# Patient Record
Sex: Male | Born: 1935 | Race: White | Hispanic: No | Marital: Single | Smoking: Former smoker
Health system: Southern US, Community
[De-identification: ages and names within clinical notes are randomized; demographics above are authoritative.]

## PROBLEM LIST (undated history)

## (undated) DIAGNOSIS — K5190 Ulcerative colitis, unspecified, without complications: Secondary | ICD-10-CM

## (undated) DIAGNOSIS — B353 Tinea pedis: Secondary | ICD-10-CM

## (undated) DIAGNOSIS — N401 Enlarged prostate with lower urinary tract symptoms: Secondary | ICD-10-CM

## (undated) DIAGNOSIS — H109 Unspecified conjunctivitis: Secondary | ICD-10-CM

## (undated) DIAGNOSIS — I10 Essential (primary) hypertension: Secondary | ICD-10-CM

## (undated) DIAGNOSIS — M1990 Unspecified osteoarthritis, unspecified site: Secondary | ICD-10-CM

## (undated) DIAGNOSIS — R29898 Other symptoms and signs involving the musculoskeletal system: Secondary | ICD-10-CM

## (undated) DIAGNOSIS — J158 Pneumonia due to other specified bacteria: Secondary | ICD-10-CM

## (undated) DIAGNOSIS — E079 Disorder of thyroid, unspecified: Secondary | ICD-10-CM

## (undated) DIAGNOSIS — N138 Other obstructive and reflux uropathy: Secondary | ICD-10-CM

## (undated) DIAGNOSIS — R609 Edema, unspecified: Secondary | ICD-10-CM

---

## 2013-02-19 ENCOUNTER — Inpatient Hospital Stay (HOSPITAL_COMMUNITY): Payer: Medicare Other

## 2013-02-19 ENCOUNTER — Inpatient Hospital Stay (HOSPITAL_COMMUNITY)
Admission: EM | Admit: 2013-02-19 | Discharge: 2013-02-23 | DRG: 194 | Disposition: A | Discharge status: Skilled Nursing Facility | Payer: Medicare Other | Attending: Internal Medicine | Admitting: Internal Medicine

## 2013-02-19 ENCOUNTER — Encounter (HOSPITAL_COMMUNITY): Payer: Self-pay | Admitting: *Deleted

## 2013-02-19 ENCOUNTER — Emergency Department (HOSPITAL_COMMUNITY): Payer: Medicare Other

## 2013-02-19 DIAGNOSIS — R296 Repeated falls: Secondary | ICD-10-CM

## 2013-02-19 DIAGNOSIS — J189 Pneumonia, unspecified organism: Secondary | ICD-10-CM | POA: Diagnosis present

## 2013-02-19 DIAGNOSIS — D649 Anemia, unspecified: Secondary | ICD-10-CM | POA: Diagnosis present

## 2013-02-19 DIAGNOSIS — R509 Fever, unspecified: Secondary | ICD-10-CM

## 2013-02-19 DIAGNOSIS — M1990 Unspecified osteoarthritis, unspecified site: Secondary | ICD-10-CM | POA: Diagnosis present

## 2013-02-19 DIAGNOSIS — R42 Dizziness and giddiness: Secondary | ICD-10-CM | POA: Diagnosis present

## 2013-02-19 HISTORY — DX: Other symptoms and signs involving the musculoskeletal system: R29.898

## 2013-02-19 HISTORY — DX: Other obstructive and reflux uropathy: N13.8

## 2013-02-19 HISTORY — DX: Pneumonia due to other specified bacteria: J15.8

## 2013-02-19 HISTORY — DX: Ulcerative colitis, unspecified, without complications: K51.90

## 2013-02-19 HISTORY — DX: Tinea pedis: B35.3

## 2013-02-19 HISTORY — DX: Disorder of thyroid, unspecified: E07.9

## 2013-02-19 HISTORY — DX: Benign prostatic hyperplasia with lower urinary tract symptoms: N40.1

## 2013-02-19 HISTORY — DX: Unspecified conjunctivitis: H10.9

## 2013-02-19 HISTORY — DX: Essential (primary) hypertension: I10

## 2013-02-19 HISTORY — DX: Unspecified osteoarthritis, unspecified site: M19.90

## 2013-02-19 HISTORY — DX: Edema, unspecified: R60.9

## 2013-02-19 LAB — COMPREHENSIVE METABOLIC PANEL
ALT: 25 U/L (ref 0–53)
Alkaline Phosphatase: 159 U/L — ABNORMAL HIGH (ref 39–117)
BUN: 21 mg/dL (ref 6–23)
CO2: 25 mEq/L (ref 19–32)
Calcium: 8.2 mg/dL — ABNORMAL LOW (ref 8.4–10.5)
Chloride: 102 mEq/L (ref 96–112)
GFR calc Af Amer: 86 mL/min — ABNORMAL LOW (ref >90)
GFR calc non Af Amer: 74 mL/min — ABNORMAL LOW (ref >90)
Glucose, Bld: 116 mg/dL — ABNORMAL HIGH (ref 70–99)
Potassium: 4.1 mEq/L (ref 3.5–5.1)
Sodium: 138 mEq/L (ref 135–145)
Total Bilirubin: 0.3 mg/dL (ref 0.3–1.2)

## 2013-02-19 LAB — URINALYSIS, ROUTINE W REFLEX MICROSCOPIC
Glucose, UA: NEGATIVE mg/dL
Ketones, ur: NEGATIVE mg/dL
Nitrite: NEGATIVE
Protein, ur: NEGATIVE mg/dL
Urobilinogen, UA: 1 mg/dL (ref 0.0–1.0)

## 2013-02-19 LAB — CBC WITH DIFFERENTIAL/PLATELET
Basophils Absolute: 0 10*3/uL (ref 0.0–0.1)
Eosinophils Relative: 0 % (ref 0–5)
Hemoglobin: 8.7 g/dL — ABNORMAL LOW (ref 13.0–17.0)
Lymphocytes Relative: 17 % (ref 12–46)
Lymphs Abs: 1.2 10*3/uL (ref 0.7–4.0)
MCV: 84.8 fL (ref 78.0–100.0)
Monocytes Relative: 14 % — ABNORMAL HIGH (ref 3–12)
Neutro Abs: 5 10*3/uL (ref 1.7–7.7)
Neutrophils Relative %: 70 % (ref 43–77)
Platelets: 342 10*3/uL (ref 150–400)
RBC: 3.16 MIL/uL — ABNORMAL LOW (ref 4.22–5.81)
WBC: 7.1 10*3/uL (ref 4.0–10.5)

## 2013-02-19 LAB — URINE MICROSCOPIC-ADD ON

## 2013-02-19 MED ORDER — DICLOFENAC SODIUM 1 % TD GEL
2.0000 g | Freq: Every day | TRANSDERMAL | Status: DC
  Administered 2013-02-20 – 2013-02-23 (×4): 2 g via TOPICAL
  Filled 2013-02-19: qty 100

## 2013-02-19 MED ORDER — ENSURE PLUS PO LIQD
237.0000 mL | Freq: Two times a day (BID) | ORAL | Status: DC
  Administered 2013-02-20: 237 mL via ORAL
  Filled 2013-02-19 (×4): qty 237

## 2013-02-19 MED ORDER — DEXTROSE 5 % IV SOLN
1.0000 g | INTRAVENOUS | Status: DC
  Administered 2013-02-19: 1 g via INTRAVENOUS
  Filled 2013-02-19 (×2): qty 10

## 2013-02-19 MED ORDER — ACETAMINOPHEN 500 MG PO TABS
500.0000 mg | ORAL_TABLET | Freq: Three times a day (TID) | ORAL | Status: DC
  Administered 2013-02-20 – 2013-02-23 (×9): 500 mg via ORAL
  Filled 2013-02-19 (×13): qty 1

## 2013-02-19 MED ORDER — ASPIRIN 81 MG PO CHEW
81.0000 mg | CHEWABLE_TABLET | Freq: Every day | ORAL | Status: DC
  Administered 2013-02-20 – 2013-02-23 (×4): 81 mg via ORAL
  Filled 2013-02-19 (×4): qty 1

## 2013-02-19 MED ORDER — CYANOCOBALAMIN 500 MCG PO TABS
500.0000 ug | ORAL_TABLET | Freq: Every day | ORAL | Status: DC
  Administered 2013-02-20 – 2013-02-23 (×4): 500 ug via ORAL
  Filled 2013-02-19 (×4): qty 1

## 2013-02-19 MED ORDER — LEVOTHYROXINE SODIUM 25 MCG PO TABS
25.0000 ug | ORAL_TABLET | Freq: Every day | ORAL | Status: DC
  Administered 2013-02-20 – 2013-02-23 (×4): 25 ug via ORAL
  Filled 2013-02-19 (×5): qty 1

## 2013-02-19 MED ORDER — TAMSULOSIN HCL 0.4 MG PO CAPS
0.4000 mg | ORAL_CAPSULE | Freq: Every day | ORAL | Status: DC
  Administered 2013-02-20 – 2013-02-23 (×4): 0.4 mg via ORAL
  Filled 2013-02-19 (×4): qty 1

## 2013-02-19 MED ORDER — DEXTROSE 5 % IV SOLN
500.0000 mg | INTRAVENOUS | Status: DC
  Administered 2013-02-20: 500 mg via INTRAVENOUS
  Filled 2013-02-19: qty 500

## 2013-02-19 MED ORDER — ARTIFICIAL TEARS OP OINT
1.0000 "application " | TOPICAL_OINTMENT | Freq: Two times a day (BID) | OPHTHALMIC | Status: DC
  Administered 2013-02-20 – 2013-02-23 (×7): 1 via OPHTHALMIC
  Filled 2013-02-19 (×2): qty 3.5

## 2013-02-19 MED ORDER — FERROUS SULFATE 325 (65 FE) MG PO TABS
325.0000 mg | ORAL_TABLET | Freq: Three times a day (TID) | ORAL | Status: DC
  Administered 2013-02-20 – 2013-02-23 (×12): 325 mg via ORAL
  Filled 2013-02-19 (×14): qty 1

## 2013-02-19 MED ORDER — POLYETHYLENE GLYCOL 3350 17 G PO PACK
17.0000 g | PACK | Freq: Every day | ORAL | Status: DC
  Filled 2013-02-19: qty 1

## 2013-02-19 MED ORDER — FOLIC ACID 0.5 MG HALF TAB
400.0000 ug | ORAL_TABLET | Freq: Every day | ORAL | Status: DC
  Administered 2013-02-20 – 2013-02-23 (×4): 0.5 mg via ORAL
  Filled 2013-02-19 (×4): qty 1

## 2013-02-19 MED ORDER — SERTRALINE HCL 50 MG PO TABS
50.0000 mg | ORAL_TABLET | Freq: Every day | ORAL | Status: DC
  Administered 2013-02-20 – 2013-02-23 (×4): 50 mg via ORAL
  Filled 2013-02-19 (×4): qty 1

## 2013-02-19 MED ORDER — ACETAMINOPHEN 325 MG PO TABS
650.0000 mg | ORAL_TABLET | Freq: Once | ORAL | Status: AC
  Administered 2013-02-19: 650 mg via ORAL
  Filled 2013-02-19: qty 2

## 2013-02-19 MED ORDER — SODIUM CHLORIDE 0.9 % IV SOLN
INTRAVENOUS | Status: DC
  Administered 2013-02-19 – 2013-02-22 (×3): via INTRAVENOUS

## 2013-02-19 MED ORDER — SULFASALAZINE 500 MG PO TABS
1000.0000 mg | ORAL_TABLET | Freq: Two times a day (BID) | ORAL | Status: DC
  Administered 2013-02-20 – 2013-02-23 (×5): 1000 mg via ORAL
  Filled 2013-02-19 (×9): qty 2

## 2013-02-19 MED ORDER — POLYVINYL ALCOHOL 1.4 % OP SOLN
1.0000 [drp] | Freq: Three times a day (TID) | OPHTHALMIC | Status: DC
  Administered 2013-02-20 – 2013-02-23 (×11): 1 [drp] via OPHTHALMIC
  Filled 2013-02-19: qty 15

## 2013-02-19 MED ORDER — DOCUSATE SODIUM 100 MG PO CAPS
100.0000 mg | ORAL_CAPSULE | Freq: Every day | ORAL | Status: DC
  Filled 2013-02-19: qty 1

## 2013-02-19 NOTE — ED Notes (Signed)
Pt placed on 2L [place] 96%

## 2013-02-19 NOTE — ED Notes (Signed)
Dr Allen in to see pt.

## 2013-02-19 NOTE — H&P (Signed)
[facility] History and Physical  Johnjoseph Rolfe UEA:[id number] DOB: Oct 04, 1916 DOA: 02/19/2013  Referring physician: ED PCP: No primary provider on file.   Chief Complaint: Fall  HPI: Jacob Ortega is a 79 y.o. male who presents to the ED after a fall from a wheelchair.  He landed on his forehead.  While work up in the ED demonstrated no major injury other than a hematoma, this is his 3rd fall in the past 3 days.  While in the ED he was also noted to be running a fever of 100.6, workup ensued and he is suspected of having an occult PNA based on CXR and hypoxia in the ED.  He was started on rocephin and azithromycin and hospitalist has been asked to admit.  Review of Systems: Positive for new bilateral hand swelling in his fingers recently, this occasionally gets painful, no history of RA in the past, 12 systems reviewed and otherwise negative.  Past Medical History  Diagnosis Date  . Arthritis   . Thyroid disease    History reviewed. No pertinent past surgical history. Social History:  has no tobacco, alcohol, and drug history on file.   Not on File  Family History  Problem Relation Age of Onset  . Family history unknown: Yes     Prior to Admission medications   Medication Sig Start Date End Date Taking? Authorizing Provider  acetaminophen (TYLENOL) 500 MG tablet Take 500 mg by mouth 3 (three) times daily.   Yes Historical Provider, MD  artificial tears (LACRILUBE) OINT ophthalmic ointment Place 1 application into the right eye 2 (two) times daily.   Yes Historical Provider, MD  aspirin 81 MG chewable tablet Chew 81 mg by mouth daily.   Yes Historical Provider, MD  cyanocobalamin 500 MCG tablet Take 500 mcg by mouth daily.   Yes Historical Provider, MD  diclofenac sodium (VOLTAREN) 1 % GEL Apply 2 g topically daily. To right knee and left hand   Yes Historical Provider, MD  docusate sodium (COLACE) 100 MG capsule Take 100 mg by mouth daily.   Yes Historical Provider, MD   ENSURE PLUS (ENSURE PLUS) LIQD Take 237 mLs by mouth 2 (two) times daily between meals.   Yes Historical Provider, MD  ferrous sulfate 325 (65 FE) MG EC tablet Take 325 mg by mouth 3 (three) times daily with meals.   Yes Historical Provider, MD  folic acid (FOLVITE) 400 MCG tablet Take 400 mcg by mouth daily.   Yes Historical Provider, MD  levothyroxine (SYNTHROID, LEVOTHROID) 25 MCG tablet Take 25 mcg by mouth daily before breakfast.   Yes Historical Provider, MD  Liniments (SALONPAS PAIN RELIEF PATCH EX) Apply 1 patch topically 2 (two) times daily.   Yes Historical Provider, MD  Polyethyl Glycol-Propyl Glycol (SYSTANE) 0.4-0.3 % SOLN Place 1 drop into the right eye 3 (three) times daily.   Yes Historical Provider, MD  polyethylene glycol powder (GLYCOLAX/MIRALAX) powder Take 17 g by mouth daily.   Yes Historical Provider, MD  sertraline (ZOLOFT) 50 MG tablet Take 50 mg by mouth daily.   Yes Historical Provider, MD  Skin Protectants, Misc. (BAZA PROTECT EX) Apply 1 application topically 2 (two) times daily.   Yes Historical Provider, MD  sulfaSALAzine (AZULFIDINE) 500 MG tablet Take 1,000 mg by mouth 2 (two) times daily.   Yes Historical Provider, MD  tamsulosin (FLOMAX) 0.4 MG CAPS capsule Take 0.4 mg by mouth daily.   Yes Historical Provider, MD   Physical Exam: Filed Vitals:  02/19/13 2300  BP: 121/65  Pulse: 73  Temp:   Resp: 17    General:  NAD, resting comfortably in bed Eyes: PEERLA EOMI ENT: mucous membranes moist Neck: supple w/o JVD Cardiovascular: RRR w/o MRG Respiratory: CTA B Abdomen: soft, nt, nd, bs+ Skin: no rash nor lesion Musculoskeletal: MAE, swelling noted to MCP joints on B hands, not tender at this time Psychiatric: normal tone and affect Neurologic: AAOx3, grossly non-focal  Labs on Admission:  Basic Metabolic Panel:  Recent Labs Lab 02/19/13 2028  NA 138  K 4.1  CL 102  CO2 25  GLUCOSE 116*  BUN 21  CREATININE 0.80  CALCIUM 8.2*   Liver  Function Tests:  Recent Labs Lab 02/19/13 2028  AST 30  ALT 25  ALKPHOS 159*  BILITOT 0.3  PROT 7.6  ALBUMIN 2.5*   No results found for this basename: LIPASE, AMYLASE,  in the last 168 hours No results found for this basename: AMMONIA,  in the last 168 hours CBC:  Recent Labs Lab 02/19/13 2028  WBC 7.1  NEUTROABS 5.0  HGB 8.7*  HCT 26.8*  MCV 84.8  PLT 342   Cardiac Enzymes: No results found for this basename: CKTOTAL, CKMB, CKMBINDEX, TROPONINI,  in the last 168 hours  BNP (last 3 results) No results found for this basename: PROBNP,  in the last 8760 hours CBG: No results found for this basename: GLUCAP,  in the last 168 hours  Radiological Exams on Admission: Dg Chest 2 View  02/19/2013   CLINICAL DATA:  Multiple falls. Change in mental status.  EXAM: CHEST  2 VIEW  COMPARISON:  None.  FINDINGS: Heart is upper limits normal in size. Scarring or atelectasis in the lung bases. Lungs otherwise clear. No effusions. No acute bony abnormality. Degenerative changes in the thoracic spine and shoulders.  IMPRESSION: Probable bibasilar scarring or atelectasis. No active cardiopulmonary disease.   Electronically Signed   By: Charlett Nose M.D.   On: 02/19/2013 22:55   Ct Head Wo Contrast  02/19/2013   CLINICAL DATA:  Larey Seat. Change in mental status.  Forehead laceration.  EXAM: CT HEAD WITHOUT CONTRAST  TECHNIQUE: Contiguous axial images were obtained from the base of the skull through the vertex without intravenous contrast.  COMPARISON:  None.  FINDINGS: Age related cerebral atrophy, ventriculomegaly and periventricular white matter disease. No extra-axial fluid collections are identified. No CT findings for acute hemispheric infarction an or intracranial hemorrhage. Benign-appearing bilateral basal ganglia calcifications are noted. The brainstem and cerebellum are grossly normal. Mild cerebellar atrophy.  No acute skull fracture. The visualized facial bones are intact. The paranasal  sinuses and mastoid air cells are clear. Minimal scattered ethmoid mucoperiosteal thickening.  IMPRESSION: Age related cerebral atrophy, ventriculomegaly and periventricular white matter disease.  No acute intracranial findings or skull fracture.   Electronically Signed   By: Loralie Champagne M.D.   On: 02/19/2013 21:22    EKG: Independently reviewed.  Assessment/Plan Principal Problem:   CAP (community acquired pneumonia) Active Problems:   Arthritis   1. CAP - treating patient for CAP empirically, on PNA pathway 2. Arthritis - the bilateral hand arthritis is new and given the distribution highly suspicious, unclear why the patient is on sulfasalazine (autoimmune disease in past?).  New onset RA would be possible but less likely in a 79 year old.  Hand X rays ordered to further evaluate.    Code Status: Full (must indicate code status--if unknown or must be presumed, indicate so)  Family Communication: Family at bedside (indicate person spoken with, if applicable, with phone number if by telephone) Disposition Plan: Admit to inpatient (indicate anticipated LOS)  Time spent: 50 min  GARDNER, JARED M. [facility] Pager [phone]  If 7PM-7AM, please contact night-coverage [email or web address] Password [facility] 02/19/2013, 11:44 PM

## 2013-02-19 NOTE — ED Provider Notes (Signed)
CSN: [id number]     Arrival date & time 02/19/13  1930 History   First MD Initiated Contact with Patient 02/19/13 2000     Chief Complaint  Patient presents with  . Fall   (Consider location/radiation/quality/duration/timing/severity/associated sxs/prior Treatment) Patient is a 79 y.o. male presenting with fall. The history is provided by the patient and a relative.  Fall   patient here after a fall from a wheelchair. Patient leaned forward to get something and fell onto his forehead. No loss of consciousness. Patient denies any neck pain. Denies any hip pain. No abdominal or chest pain. Has had 3 falls in the past 3 days. Possible mental status changes but according to his relatives he appears to be at his baseline. Questionable strong smelling urine. No recent fever, vomiting, cough. EMS was called and patient transported here. He does live in assisted living. His baseline status is that he cannulated only with lots of assistance  Past Medical History  Diagnosis Date  . Arthritis   . Thyroid disease    History reviewed. No pertinent past surgical history. Family History  Problem Relation Age of Onset  . Family history unknown: Yes   History  Substance Use Topics  . Smoking status: Not on file  . Smokeless tobacco: Not on file  . Alcohol Use: Not on file    Review of Systems  All other systems reviewed and are negative.    Allergies  Review of patient's allergies indicates not on file.  Home Medications   Current Outpatient Rx  Name  Route  Sig  Dispense  Refill  . acetaminophen (TYLENOL) 500 MG tablet   Oral   Take 500 mg by mouth 3 (three) times daily.         Marland Kitchen artificial tears (LACRILUBE) OINT ophthalmic ointment   Right Eye   Place 1 application into the right eye 2 (two) times daily.         Marland Kitchen aspirin 81 MG chewable tablet   Oral   Chew 81 mg by mouth daily.         . cyanocobalamin 500 MCG tablet   Oral   Take 500 mcg by mouth daily.         .  diclofenac sodium (VOLTAREN) 1 % GEL   Topical   Apply 2 g topically daily. To right knee and left hand         . docusate sodium (COLACE) 100 MG capsule   Oral   Take 100 mg by mouth daily.         Marland Kitchen ENSURE PLUS (ENSURE PLUS) LIQD   Oral   Take 237 mLs by mouth 2 (two) times daily between meals.         . ferrous sulfate 325 (65 FE) MG EC tablet   Oral   Take 325 mg by mouth 3 (three) times daily with meals.         . folic acid (FOLVITE) 400 MCG tablet   Oral   Take 400 mcg by mouth daily.         Marland Kitchen levothyroxine (SYNTHROID, LEVOTHROID) 25 MCG tablet   Oral   Take 25 mcg by mouth daily before breakfast.         . Liniments (SALONPAS PAIN RELIEF PATCH EX)   Apply externally   Apply 1 patch topically 2 (two) times daily.         Bertram Gala Glycol-Propyl Glycol (SYSTANE) 0.4-0.3 % SOLN   Right Eye  Place 1 drop into the right eye 3 (three) times daily.         . polyethylene glycol powder (GLYCOLAX/MIRALAX) powder   Oral   Take 17 g by mouth daily.         . sertraline (ZOLOFT) 50 MG tablet   Oral   Take 50 mg by mouth daily.         . Skin Protectants, Misc. (BAZA PROTECT EX)   Apply externally   Apply 1 application topically 2 (two) times daily.         Marland Kitchen sulfaSALAzine (AZULFIDINE) 500 MG tablet   Oral   Take 1,000 mg by mouth 2 (two) times daily.         . tamsulosin (FLOMAX) 0.4 MG CAPS capsule   Oral   Take 0.4 mg by mouth daily.          BP 150/73  Pulse 81  Temp(Src) 100.1 F (37.8 C) (Rectal)  Resp 14  Ht 5\' 7"  (1.702 m)  Wt 140 lb (63.504 kg)  BMI 21.92 kg/m2  SpO2 92% Physical Exam  Nursing note and vitals reviewed. Constitutional: He is oriented to person, place, and time. He appears well-developed and well-nourished.  Non-toxic appearance. No distress.  HENT:  Head: Normocephalic and atraumatic.    Eyes: Conjunctivae, EOM and lids are normal. Pupils are equal, round, and reactive to light.  Neck: Normal range  of motion. Neck supple. No spinous process tenderness and no muscular tenderness present. No tracheal deviation present. No mass present.  Cardiovascular: Normal rate, regular rhythm and normal heart sounds.  Exam reveals no gallop.   No murmur heard. Pulmonary/Chest: Effort normal and breath sounds normal. No stridor. No respiratory distress. He has no decreased breath sounds. He has no wheezes. He has no rhonchi. He has no rales. He exhibits no bony tenderness.  Abdominal: Soft. Normal appearance and bowel sounds are normal. He exhibits no distension. There is no tenderness. There is no rebound and no CVA tenderness.  Musculoskeletal: Normal range of motion. He exhibits no edema and no tenderness.  Full range of motion of both hips without shortening or deformity. Bilateral dorsalis pedis pulses are palpable.  Neurological: He is alert and oriented to person, place, and time. He has normal strength. No cranial nerve deficit or sensory deficit. GCS eye subscore is 4. GCS verbal subscore is 5. GCS motor subscore is 6.  Skin: Skin is warm and dry. No abrasion and no rash noted.  Psychiatric: He has a normal mood and affect. His speech is normal and behavior is normal.    ED Course  Procedures (including critical care time) Labs Review Labs Reviewed - No data to display Imaging Review No results found.  MDM  No diagnosis found.  Date: 02/19/2013  Rate: 77  Rhythm: normal sinus rhythm  QRS Axis: normal  Intervals: normal  ST/T Wave abnormalities: nonspecific ST changes  Conduction Disutrbances:left anterior fascicular block  Narrative Interpretation:   Old EKG Reviewed: none available  10:19 PM  Patient with mild anemia here. Head CT negative. Will check patient's stool for blood. Patient also has temperature here of 100.6. Will check chest x-ray. Continue to monitor    11:13 PM Patient's chest x-ray without evidence of pneumonia at this time. Patient was mildly hypoxemic. We  placed on oxygen for this. Suspect that he may have early pneumonia and will place on antibiotics empirically. Temperature treated. We'll have tried to see for admission  Toy Baker,  MD 02/19/13 2313

## 2013-02-19 NOTE — ED Notes (Signed)
Called [place] at [facility] - they will fax over his medical records.

## 2013-02-19 NOTE — ED Notes (Signed)
Pt arrives via EMS after [facility] reports that pt has had falls x3 days in a row. Change in mental status, and strong smelling urine. Forehead laceration after fall today. Bed sore on lower back. BP 134/55 HR 78 RR 18. 20 LFA. 12lead completed.

## 2013-02-20 ENCOUNTER — Encounter (HOSPITAL_COMMUNITY): Payer: Self-pay | Admitting: General Practice

## 2013-02-20 DIAGNOSIS — D649 Anemia, unspecified: Secondary | ICD-10-CM

## 2013-02-20 DIAGNOSIS — R296 Repeated falls: Secondary | ICD-10-CM

## 2013-02-20 LAB — LEGIONELLA ANTIGEN, URINE

## 2013-02-20 LAB — MRSA PCR SCREENING: MRSA by PCR: POSITIVE — AB

## 2013-02-20 LAB — STREP PNEUMONIAE URINARY ANTIGEN: Strep Pneumo Urinary Antigen: NEGATIVE

## 2013-02-20 MED ORDER — DOCUSATE SODIUM 100 MG PO CAPS: 100.0000 mg | ORAL_CAPSULE | Freq: Every day | ORAL | Status: DC | PRN

## 2013-02-20 MED ORDER — POLYETHYLENE GLYCOL 3350 17 G PO PACK
17.0000 g | PACK | Freq: Every day | ORAL | Status: DC | PRN
  Filled 2013-02-20: qty 1

## 2013-02-20 MED ORDER — CHLORHEXIDINE GLUCONATE CLOTH 2 % EX PADS
6.0000 | MEDICATED_PAD | Freq: Every day | CUTANEOUS | Status: DC
  Administered 2013-02-23: 07:00:00 6 via TOPICAL

## 2013-02-20 MED ORDER — ENSURE COMPLETE PO LIQD
237.0000 mL | Freq: Two times a day (BID) | ORAL | Status: DC
  Administered 2013-02-20 – 2013-02-23 (×6): 237 mL via ORAL

## 2013-02-20 MED ORDER — MUPIROCIN 2 % EX OINT
1.0000 "application " | TOPICAL_OINTMENT | Freq: Two times a day (BID) | CUTANEOUS | Status: DC
  Administered 2013-02-20 – 2013-02-23 (×7): 1 via NASAL
  Filled 2013-02-20: qty 22

## 2013-02-20 NOTE — Progress Notes (Signed)
CRITICAL VALUE ALERT  Critical value received:  Positive MRSA  Date of notification:  02/20/2013  Time of notification:  0351  Critical value read back:yes  Nurse who received alert: Judy Pimple, RN  MD notified (1st page):    Time of first page:   MD notified (2nd page):  Time of second page:  Responding MD:    Time MD responded: positive MRSA protocol was put in

## 2013-02-20 NOTE — Progress Notes (Signed)
INITIAL NUTRITION ASSESSMENT  DOCUMENTATION CODES Per approved criteria  -Not Applicable   INTERVENTION: 1. Continue Ensure Complete po BID, each supplement provides 350 kcal and 13 grams of protein.   NUTRITION DIAGNOSIS: Inadequate oral intake related to poor appetite as evidenced by pt report.   Goal: PO intake to meet >/=90% estimated nutrition needs.   Monitor:  PO intake, weight trends, labs   Reason for Assessment: Malnutrition Screening tool  79 y.o. male  Admitting Dx: CAP (community acquired pneumonia)  ASSESSMENT: Pt admitted with frequent falls and found to have PNA.  Pt states that he had had a decreased appetite over the past 8-9 months, and lost "a little" weight. Pt unsure of amount. Reports usual weight is >170 lbs, but not sure last time he weighed that.   Height: Ht Readings from Last 1 Encounters:  02/19/13 5\' 7"  (1.702 m)    Weight: Wt Readings from Last 1 Encounters:  02/20/13 142 lb 10.2 oz (64.7 kg)    Ideal Body Weight: 148 lbs   % Ideal Body Weight: 96%  Wt Readings from Last 10 Encounters:  02/20/13 142 lb 10.2 oz (64.7 kg)    Usual Body Weight: 170 lbs   % Usual Body Weight: 83%  BMI:  Body mass index is 22.33 kg/(m^2). WNL  Estimated Nutritional Needs: Kcal: 1400-1600 Protein: 65-80 gm Fluid: >/= 1.5 L   Skin: stage II on coccyx    Diet Order: Cardiac  EDUCATION NEEDS: -No education needs identified at this time   Intake/Output Summary (Last 24 hours) at 02/20/13 1154 Last data filed at 02/20/13 0927  Gross per 24 hour  Intake      0 ml  Output    650 ml  Net   -650 ml    Last BM: PTA    Labs:   Recent Labs Lab 02/19/13 2028  NA 138  K 4.1  CL 102  CO2 25  BUN 21  CREATININE 0.80  CALCIUM 8.2*  GLUCOSE 116*    CBG (last 3)  No results found for this basename: GLUCAP,  in the last 72 hours  Scheduled Meds: . acetaminophen  500 mg Oral TID  . artificial tears  1 application Right Eye BID  .  aspirin  81 mg Oral Daily  . Chlorhexidine Gluconate Cloth  6 each Topical Q0600  . cyanocobalamin  500 mcg Oral Daily  . diclofenac sodium  2 g Topical Daily  . ENSURE PLUS  237 mL Oral BID BM  . ferrous sulfate  325 mg Oral TID WC  . folic acid  400 mcg Oral Daily  . levothyroxine  25 mcg Oral QAC breakfast  . mupirocin ointment  1 application Nasal BID  . polyvinyl alcohol  1 drop Right Eye TID  . sertraline  50 mg Oral Daily  . sulfaSALAzine  1,000 mg Oral BID  . tamsulosin  0.4 mg Oral Daily    Continuous Infusions: . sodium chloride 125 mL/hr at 02/20/13 0220    Past Medical History  Diagnosis Date  . Arthritis   . Thyroid disease   . Ulcerative colitis   . BPH (benign prostatic hypertrophy) with urinary obstruction   . Conjunctivitis   . Hypertension   . Pneumonia due to other specified bacteria(482.89)   . Lower extremity weakness   . Tinea pedis   . Edema     History reviewed. No pertinent past surgical history.  Isabell Jarvis RD, LDN Pager [phone] After Hours pager [phone]

## 2013-02-20 NOTE — Progress Notes (Signed)
Addendum  Patient seen and examined, chart and data base reviewed.  I agree with the above assessment and plan.  For full details please see Mrs. Algis Downs PA note.  Per patient has progressive decline in his functional status, PT/OT to evaluate.  His brother-in-law was at bedside he is a retired Optometrist, he wants him to get back to his ALF with more service/care.   Clint Lipps, MD [facility] Pager: [phone] 02/20/2013, 5:02 PM

## 2013-02-20 NOTE — Evaluation (Signed)
Physical Therapy Evaluation Patient Details Name: Jacob Ortega MRN: [id number] DOB: 11-06-1916 Today's Date: 02/20/2013 Time: 4098-1191 PT Time Calculation (min): 31 min  PT Assessment / Plan / Recommendation History of Present Illness  Pt is a 79 y/o male admitted s/p fall from w/c with head contusion.  Clinical Impression  This patient had increased difficulty with ambulation with the walker. Per nursing, pt was dependent transfer from bed to [facility], where PT began session. Pt was able to stand with min-mod assist so NA could perform hygiene, however pt fatigues quickly. He was able to ambulate ~15 feet with the RW but required close chair follow as pt had significant posterior lean and lost balance easily. Pt generally confused throughout session and was only oriented to self. This patient is appropriate for skilled PT interventions to address functional limitations, improve safety and independence with functional mobility, and return to PLOF.    PT Assessment  Patient needs continued PT services    Follow Up Recommendations  SNF    Does the patient have the potential to tolerate intense rehabilitation      Barriers to Discharge        Equipment Recommendations  None recommended by PT    Recommendations for Other Services     Frequency Min 3X/week    Precautions / Restrictions Precautions Precautions: Fall Restrictions Weight Bearing Restrictions: No   Pertinent Vitals/Pain Pt does not report any pain at this time.      Mobility  Bed Mobility Bed Mobility: Not assessed Transfers Transfers: Sit to Stand;Stand to Sit Sit to Stand: 1: +1 Total assist;From chair/3-in-1;With armrests Stand to Sit: 3: Mod assist;To chair/3-in-1;With armrests Details for Transfer Assistance: Pt required increased cueing for hand placement and safety awareness prior to initiating transfers. Ambulation/Gait Ambulation/Gait Assistance: 3: Mod assist;2: Max Environmental consultant (Feet): 15  Feet Assistive device: Rolling walker Ambulation/Gait Assistance Details: Pt took one seated rest break 1/2 way between [facility] and recliner. Mod assist for most ambulation and max assist to stay balanced while chair was being brought up to him. VC's for improved posture and safety awareness. Gait Pattern: Step-through pattern;Decreased stride length Gait velocity: decreased General Gait Details: Posterior lean Stairs: No    Exercises     PT Diagnosis: Difficulty walking  PT Problem List: Decreased strength;Decreased range of motion;Decreased activity tolerance;Decreased balance;Decreased mobility;Decreased knowledge of use of DME;Decreased safety awareness;Decreased knowledge of precautions PT Treatment Interventions: DME instruction;Gait training;Stair training;Functional mobility training;Therapeutic activities;Therapeutic exercise;Neuromuscular re-education;Patient/family education     PT Goals(Current goals can be found in the care plan section) Acute Rehab PT Goals Patient Stated Goal: To walk better PT Goal Formulation: With patient Time For Goal Achievement: 02/27/13 Potential to Achieve Goals: Fair  Visit Information  Last PT Received On: 02/20/13 Assistance Needed: +2 (chair follow) History of Present Illness: Pt is a 79 y/o male admitted s/p fall from w/c with head contusion.       Prior Functioning  Home Living Family/patient expects to be discharged to:: Private residence (ALF?) Living Arrangements: Non-relatives/Friends (ALF) Available Help at Discharge: Personal care attendant;Available PRN/intermittently (ALF) Type of Home: Assisted living Home Access: Level entry Home Layout: One level Home Equipment: Wheelchair - [employer] - 2 wheels Additional Comments: Pt confused about current living situation, giving some details from when he lived with his parents as a child. Prior Function Level of Independence: Independent with assistive  device(s) Communication Communication: No difficulties Dominant Hand: Right    Cognition  Cognition Arousal/Alertness: Awake/alert Behavior During  Therapy: WFL for tasks assessed/performed Overall Cognitive Status: No family/caregiver present to determine baseline cognitive functioning Memory: Decreased short-term memory (Not oriented to place or time)    Extremity/Trunk Assessment Upper Extremity Assessment Upper Extremity Assessment: Defer to OT evaluation Lower Extremity Assessment Lower Extremity Assessment: Generalized weakness Cervical / Trunk Assessment Cervical / Trunk Assessment: Kyphotic   Balance Balance Balance Assessed: Yes Static Sitting Balance Static Sitting - Balance Support: Bilateral upper extremity supported;Feet supported Static Sitting - Level of Assistance: 5: Stand by assistance Static Sitting - Comment/# of Minutes: 5 Static Standing Balance Static Standing - Balance Support: Bilateral upper extremity supported Static Standing - Level of Assistance: 4: Min assist;3: Mod assist Static Standing - Comment/# of Minutes: 2  End of Session PT - End of Session Equipment Utilized During Treatment: Gait belt Activity Tolerance: Patient limited by fatigue Patient left: in chair;with call bell/phone within reach Nurse Communication: Mobility status  GP     Ruthann Cancer 02/20/2013, 4:36 PM  Ruthann Cancer, PT, DPT Acute Rehabilitation Services

## 2013-02-20 NOTE — Progress Notes (Signed)
[facility] PROGRESS NOTE  Jacob Ortega WUJ:[id number] DOB: 05-31-1916 DOA: 02/19/2013 PCP: No primary provider on file.  Assessment/Plan:  Fall from wheelchair with head contusion Frequent falls recently. CT Head negative for bleed or acute changes PT / OT requested.  Fever Uncertain etiology 100.6 in the ED Urine is negative for infection. CXR not convincing for pneumonia - Rocephin/Azith were discontinued this am.  Diarrhea History of Ulcerative Colitis on sulfasalazine RN reports stools are loose and tarry colored Will check c-diff PCR. Change miralax and colace to PRN  Anemia Hgb 8.7 and normocytic.   No baseline in EPIC. Will guiac stool. Consider transfusion if we believe his hgb has dropped and is contributing to his falls.   DVT Prophylaxis:  SCDs  Code Status: full Family Communication:  Disposition Plan: inpatient.   Consultants:    Procedures:    Antibiotics:    HPI/Subjective: Jacob Ortega is pleasantly confused.  He reports that he has fallen 5 times in the last several days. No other complaints. He mentions that he is wet and has had several bowel movements this morning, but doesn't know where they have gone.  Objective: Filed Vitals:   02/20/13 0026 02/20/13 0030 02/20/13 0130 02/20/13 0521  BP:  133/72 133/67 135/66  Pulse: 63 63 65 70  Temp: 98.6 F (37 C)  97.5 F (36.4 C) 98.1 F (36.7 C)  TempSrc: Oral  Oral Oral  Resp: 21 15 15 15   Height:      Weight:   64.7 kg (142 lb 10.2 oz)   SpO2: 94% 95% 94% 95%    Intake/Output Summary (Last 24 hours) at 02/20/13 1037 Last data filed at 02/20/13 0927  Gross per 24 hour  Intake      0 ml  Output    650 ml  Net   -650 ml   Filed Weights   02/19/13 1933 02/20/13 0130  Weight: 63.504 kg (140 lb) 64.7 kg (142 lb 10.2 oz)    Exam:   General:  A&O, NAD, Lying comfortably in bed, pleasantly confused. 2" area of swelling on right forehead  Cardiovascular: RRR, no murmurs,  rubs or gallops, 1+ bilateral lower ext swelling.  Respiratory: CTA, no wheeze, crackles, or rales.  No increased work of breathing.  Abdomen: Soft, non-tender, non-distended, + bowel sounds, no masses  Musculoskeletal: Able to move all 4 extremities, 5/5 strength in each  Data Reviewed: Basic Metabolic Panel:  Recent Labs Lab 02/19/13 2028  NA 138  K 4.1  CL 102  CO2 25  GLUCOSE 116*  BUN 21  CREATININE 0.80  CALCIUM 8.2*   Liver Function Tests:  Recent Labs Lab 02/19/13 2028  AST 30  ALT 25  ALKPHOS 159*  BILITOT 0.3  PROT 7.6  ALBUMIN 2.5*   CBC:  Recent Labs Lab 02/19/13 2028  WBC 7.1  NEUTROABS 5.0  HGB 8.7*  HCT 26.8*  MCV 84.8  PLT 342    Recent Results (from the past 240 hour(s))  MRSA PCR SCREENING     Status: Abnormal   Collection Time    02/20/13  1:37 AM      Result Value Range Status   MRSA by PCR POSITIVE (*) NEGATIVE Final   Comment:            The GeneXpert MRSA Assay (FDA     approved for NASAL specimens     only), is one component of a     comprehensive MRSA colonization  surveillance program. It is not     intended to diagnose MRSA     infection nor to guide or     monitor treatment for     MRSA infections.     RESULT CALLED TO, READ BACK BY AND VERIFIED WITH:     MILAM,T RN [id number] AT 0348 SKEEN,P     Studies: Dg Chest 2 View  02/19/2013   CLINICAL DATA:  Multiple falls. Change in mental status.  EXAM: CHEST  2 VIEW  COMPARISON:  None.  FINDINGS: Heart is upper limits normal in size. Scarring or atelectasis in the lung bases. Lungs otherwise clear. No effusions. No acute bony abnormality. Degenerative changes in the thoracic spine and shoulders.  IMPRESSION: Probable bibasilar scarring or atelectasis. No active cardiopulmonary disease.   Electronically Signed   By: Charlett Nose M.D.   On: 02/19/2013 22:55   Ct Head Wo Contrast  02/19/2013   CLINICAL DATA:  Larey Seat. Change in mental status.  Forehead laceration.  EXAM: CT HEAD  WITHOUT CONTRAST  TECHNIQUE: Contiguous axial images were obtained from the base of the skull through the vertex without intravenous contrast.  COMPARISON:  None.  FINDINGS: Age related cerebral atrophy, ventriculomegaly and periventricular white matter disease. No extra-axial fluid collections are identified. No CT findings for acute hemispheric infarction an or intracranial hemorrhage. Benign-appearing bilateral basal ganglia calcifications are noted. The brainstem and cerebellum are grossly normal. Mild cerebellar atrophy.  No acute skull fracture. The visualized facial bones are intact. The paranasal sinuses and mastoid air cells are clear. Minimal scattered ethmoid mucoperiosteal thickening.  IMPRESSION: Age related cerebral atrophy, ventriculomegaly and periventricular white matter disease.  No acute intracranial findings or skull fracture.   Electronically Signed   By: Loralie Champagne M.D.   On: 02/19/2013 21:22   Dg Hand Complete Left  02/20/2013   CLINICAL DATA:  Pain and swelling.  EXAM: LEFT HAND - COMPLETE 3+ VIEW  COMPARISON:  None.  FINDINGS: Diffuse osteoporosis.  No acute fracture or arthropathic findings.  IMPRESSION: No acute bony findings or significant degenerative changes.  Osteoporosis.   Electronically Signed   By: Loralie Champagne M.D.   On: 02/20/2013 00:07   Dg Hand Complete Right  02/20/2013   CLINICAL DATA:  Hand pain and swelling.  EXAM: RIGHT HAND - COMPLETE 3+ VIEW  COMPARISON:  None  FINDINGS: Diffuse osteoporosis. Mild degenerative changes. No arthropathic findings. No acute fracture.  IMPRESSION: No acute bony findings or significant arthropathic changes.  Osteoporosis.   Electronically Signed   By: Loralie Champagne M.D.   On: 02/20/2013 00:07    Scheduled Meds: . acetaminophen  500 mg Oral TID  . artificial tears  1 application Right Eye BID  . aspirin  81 mg Oral Daily  . Chlorhexidine Gluconate Cloth  6 each Topical Q0600  . cyanocobalamin  500 mcg Oral Daily  .  diclofenac sodium  2 g Topical Daily  . ENSURE PLUS  237 mL Oral BID BM  . ferrous sulfate  325 mg Oral TID WC  . folic acid  400 mcg Oral Daily  . levothyroxine  25 mcg Oral QAC breakfast  . mupirocin ointment  1 application Nasal BID  . polyvinyl alcohol  1 drop Right Eye TID  . sertraline  50 mg Oral Daily  . sulfaSALAzine  1,000 mg Oral BID  . tamsulosin  0.4 mg Oral Daily   Continuous Infusions: . sodium chloride 125 mL/hr at 02/20/13 0220    Principal  Problem:   CAP (community acquired pneumonia) Active Problems:   Arthritis   Anemia   Falling    Conley Canal  [facility] Pager [phone]. If 7PM-7AM, please contact night-coverage at [email or web address], password [facility] 02/20/2013, 10:37 AM  LOS: 1 day

## 2013-02-20 NOTE — Progress Notes (Signed)
  Pt admitted to the unit. Pt is stable, alert and oriented per baseline. Oriented to room, staff, and call bell. Educated to call for any assistance. Bed in lowest position, call bell within reach- will continue to monitor. 

## 2013-02-20 NOTE — Progress Notes (Signed)
Pt refused oral meds. Sulfasalazine and tylenol. RN education pt on the importance of taking medication but pt still refused. Will continue to monitor

## 2013-02-21 LAB — PREPARE RBC (CROSSMATCH)

## 2013-02-21 LAB — CBC
HCT: 26.3 % — ABNORMAL LOW (ref 39.0–52.0)
Hemoglobin: 8.6 g/dL — ABNORMAL LOW (ref 13.0–17.0)
MCHC: 32.7 g/dL (ref 30.0–36.0)
MCV: 85.1 fL (ref 78.0–100.0)

## 2013-02-21 MED ORDER — DEXTROSE 5 % IV SOLN
500.0000 mg | INTRAVENOUS | Status: DC
  Administered 2013-02-21 – 2013-02-23 (×3): 500 mg via INTRAVENOUS
  Filled 2013-02-21 (×3): qty 500

## 2013-02-21 MED ORDER — DEXTROSE 5 % IV SOLN
1.0000 g | INTRAVENOUS | Status: DC
  Administered 2013-02-21 – 2013-02-23 (×3): 1 g via INTRAVENOUS
  Filled 2013-02-21 (×3): qty 10

## 2013-02-21 NOTE — Progress Notes (Signed)
[facility] PROGRESS NOTE  Jacob Ortega ZOX:[id number] DOB: [birth year] DOA: 02/19/2013 PCP: No primary provider on file.  Assessment/Plan:  Fall from wheelchair with head contusion Frequent falls recently. CT Head negative for bleed or acute changes PT / OT evaluation recommends SNF.  Family politely refuses SNF.  They want him back at ALF with increased support.  Fever Uncertain etiology Urine is negative for infection. CXR not convincing for pneumonia  Rocephin/Azith were restarted this am (10/2) as the patient appears more confused and developed a low grade temp overnight.  Diarrhea History of Ulcerative Colitis on sulfasalazine RN reports stools are loose and tarry colored Will check c-diff PCR. Change miralax and colace to PRN  Anemia Hgb 8.7 and normocytic.   No baseline in EPIC. Guiac negative. Will transfuse 1 unit PRBCs for symptomatic anemia (10/2)  Dementia Pleasantly dementia Slightly exacerbated likely due to being in an unfamiliar environment.   DVT Prophylaxis:  SCDs  Code Status: full Family Communication: Brother in Social worker and nephew at bedside. Disposition Plan: inpatient. Possible d/c on 10/3   Antibiotics:  Azith / Rocephin on 9/30 and again on 10/2  HPI/Subjective: Jacob Ortega more confused today.  Only gives 1 word answers to questions.  Objective: Filed Vitals:   02/20/13 0521 02/20/13 1521 02/20/13 2117 02/21/13 0542  BP: 135/66 120/59 121/56 147/71  Pulse: 70 65 64 82  Temp: 98.1 F (36.7 C) 98 F (36.7 C) 98.7 F (37.1 C) 99.2 F (37.3 C)  TempSrc: Oral Oral Oral Axillary  Resp: 15 18 17 18   Height:      Weight:      SpO2: 95% 94% 91% 93%    Intake/Output Summary (Last 24 hours) at 02/21/13 1306 Last data filed at 02/21/13 0600  Gross per 24 hour  Intake  551.5 ml  Output      0 ml  Net  551.5 ml   Filed Weights   02/19/13 1933 02/20/13 0130  Weight: 63.504 kg (140 lb) 64.7 kg (142 lb 10.2 oz)     Exam:   General:  A&O, NAD, Lying comfortably in bed, confused. 2" area of swelling on right forehead  Cardiovascular: RRR, no murmurs, rubs or gallops, 1+ bilateral lower ext swelling.  Respiratory: CTA, no wheeze, crackles, or rales.  No increased work of breathing.  Abdomen: Soft, non-tender, non-distended, + bowel sounds, no masses  Musculoskeletal: Able to move all 4 extremities, 5/5 strength in each  Data Reviewed: Basic Metabolic Panel:  Recent Labs Lab 02/19/13 2028  NA 138  K 4.1  CL 102  CO2 25  GLUCOSE 116*  BUN 21  CREATININE 0.80  CALCIUM 8.2*   Liver Function Tests:  Recent Labs Lab 02/19/13 2028  AST 30  ALT 25  ALKPHOS 159*  BILITOT 0.3  PROT 7.6  ALBUMIN 2.5*   CBC:  Recent Labs Lab 02/19/13 2028 02/21/13 0530  WBC 7.1 7.8  NEUTROABS 5.0  --   HGB 8.7* 8.6*  HCT 26.8* 26.3*  MCV 84.8 85.1  PLT 342 344    Recent Results (from the past 240 hour(s))  CULTURE, BLOOD (ROUTINE X 2)     Status: None   Collection Time    02/20/13 12:15 AM      Result Value Range Status   Specimen Description BLOOD RIGHT ARM   Final   Special Requests BOTTLES DRAWN AEROBIC AND ANAEROBIC 10CC EACH   Final   Culture  Setup Time     Final   Value:  02/20/2013 04:39     Performed at [facility]   Culture     Final   Value:        BLOOD CULTURE RECEIVED NO GROWTH TO DATE CULTURE WILL BE HELD FOR 5 DAYS BEFORE ISSUING A FINAL NEGATIVE REPORT     Performed at [facility]   Report Status PENDING   Incomplete  CULTURE, BLOOD (ROUTINE X 2)     Status: None   Collection Time    02/20/13 12:25 AM      Result Value Range Status   Specimen Description BLOOD LEFT HAND   Final   Special Requests BOTTLES DRAWN AEROBIC ONLY 10CC   Final   Culture  Setup Time     Final   Value: 02/20/2013 04:39     Performed at [facility]   Culture     Final   Value:        BLOOD CULTURE RECEIVED NO GROWTH TO DATE CULTURE WILL BE HELD FOR 5 DAYS  BEFORE ISSUING A FINAL NEGATIVE REPORT     Performed at [facility]   Report Status PENDING   Incomplete  MRSA PCR SCREENING     Status: Abnormal   Collection Time    02/20/13  1:37 AM      Result Value Range Status   MRSA by PCR POSITIVE (*) NEGATIVE Final   Comment:            The GeneXpert MRSA Assay (FDA     approved for NASAL specimens     only), is one component of a     comprehensive MRSA colonization     surveillance program. It is not     intended to diagnose MRSA     infection nor to guide or     monitor treatment for     MRSA infections.     RESULT CALLED TO, READ BACK BY AND VERIFIED WITH:     MILAM,T RN [id number] AT 0348 SKEEN,P     Studies: Dg Chest 2 View  02/19/2013   CLINICAL DATA:  Multiple falls. Change in mental status.  EXAM: CHEST  2 VIEW  COMPARISON:  None.  FINDINGS: Heart is upper limits normal in size. Scarring or atelectasis in the lung bases. Lungs otherwise clear. No effusions. No acute bony abnormality. Degenerative changes in the thoracic spine and shoulders.  IMPRESSION: Probable bibasilar scarring or atelectasis. No active cardiopulmonary disease.   Electronically Signed   By: Charlett Nose M.D.   On: 02/19/2013 22:55   Ct Head Wo Contrast  02/19/2013   CLINICAL DATA:  Larey Seat. Change in mental status.  Forehead laceration.  EXAM: CT HEAD WITHOUT CONTRAST  TECHNIQUE: Contiguous axial images were obtained from the base of the skull through the vertex without intravenous contrast.  COMPARISON:  None.  FINDINGS: Age related cerebral atrophy, ventriculomegaly and periventricular white matter disease. No extra-axial fluid collections are identified. No CT findings for acute hemispheric infarction an or intracranial hemorrhage. Benign-appearing bilateral basal ganglia calcifications are noted. The brainstem and cerebellum are grossly normal. Mild cerebellar atrophy.  No acute skull fracture. The visualized facial bones are intact. The paranasal sinuses and  mastoid air cells are clear. Minimal scattered ethmoid mucoperiosteal thickening.  IMPRESSION: Age related cerebral atrophy, ventriculomegaly and periventricular white matter disease.  No acute intracranial findings or skull fracture.   Electronically Signed   By: Loralie Champagne M.D.   On: 02/19/2013 21:22   Dg Hand Complete  Left  02/20/2013   CLINICAL DATA:  Pain and swelling.  EXAM: LEFT HAND - COMPLETE 3+ VIEW  COMPARISON:  None.  FINDINGS: Diffuse osteoporosis.  No acute fracture or arthropathic findings.  IMPRESSION: No acute bony findings or significant degenerative changes.  Osteoporosis.   Electronically Signed   By: Loralie Champagne M.D.   On: 02/20/2013 00:07   Dg Hand Complete Right  02/20/2013   CLINICAL DATA:  Hand pain and swelling.  EXAM: RIGHT HAND - COMPLETE 3+ VIEW  COMPARISON:  None  FINDINGS: Diffuse osteoporosis. Mild degenerative changes. No arthropathic findings. No acute fracture.  IMPRESSION: No acute bony findings or significant arthropathic changes.  Osteoporosis.   Electronically Signed   By: Loralie Champagne M.D.   On: 02/20/2013 00:07    Scheduled Meds: . acetaminophen  500 mg Oral TID  . artificial tears  1 application Right Eye BID  . aspirin  81 mg Oral Daily  . azithromycin  500 mg Intravenous Q24H  . cefTRIAXone (ROCEPHIN)  IV  1 g Intravenous Q24H  . Chlorhexidine Gluconate Cloth  6 each Topical Q0600  . cyanocobalamin  500 mcg Oral Daily  . diclofenac sodium  2 g Topical Daily  . feeding supplement  237 mL Oral BID BM  . ferrous sulfate  325 mg Oral TID WC  . folic acid  400 mcg Oral Daily  . levothyroxine  25 mcg Oral QAC breakfast  . mupirocin ointment  1 application Nasal BID  . polyvinyl alcohol  1 drop Right Eye TID  . sertraline  50 mg Oral Daily  . sulfaSALAzine  1,000 mg Oral BID  . tamsulosin  0.4 mg Oral Daily   Continuous Infusions: . sodium chloride 10 mL/hr at 02/20/13 1206    Principal Problem:   CAP (community acquired  pneumonia) Active Problems:   Arthritis   Anemia   Falling    Conley Canal  [facility] Pager [phone]. If 7PM-7AM, please contact night-coverage at [email or web address], password [facility] 02/21/2013, 1:06 PM  LOS: 2 days

## 2013-02-21 NOTE — Progress Notes (Addendum)
Addendum  Patient seen and examined, chart and data base reviewed.  I agree with the above assessment and plan.  For full details please see Mrs. Algis Downs PA note.  Low-grade fever antibiotics restarted, could be secondary to his history of UC.  Fall, but she recommended SNF. Family wants patient to go back to ALF.  Discussed with the patient in the presence of his brother-in-law, patient wants to be DNR. Chart updated.   Clint Lipps, MD [facility] Pager: [phone] 02/21/2013, 2:54 PM

## 2013-02-21 NOTE — Progress Notes (Signed)
Called Lenard Forth POA/Nephew and left message to return call.

## 2013-02-21 NOTE — Evaluation (Signed)
Occupational Therapy Evaluation Patient Details Name: Jacob Ortega MRN: [id number] DOB: 06/19/1916 Today's Date: 02/21/2013 Time: 4098-1191 OT Time Calculation (min): 27 min  OT Assessment / Plan / Recommendation History of present illness Pt is a 79 y/o male admitted s/p fall from w/c with head contusion.   Clinical Impression   Pt demos decline in function and safety with ADLs and ADL mobility with decreased strength and functional endurance. Pt would benefit from acute OT services to address impairments to increase level of function and safety. Pt requires extensive asist with ADLs and mobility and short tern SNF rehab would be safety option at this time    OT Assessment  Patient needs continued OT Services    Follow Up Recommendations  SNF;Supervision/Assistance - 24 hour    Barriers to Discharge Decreased caregiver support Uncertain if current living situation (ALF?) can privde assist and rehab that would be needed  Equipment Recommendations  Other (comment) (TBD at SNF level of care)    Recommendations for Other Services    Frequency  Min 2X/week    Precautions / Restrictions Precautions Precautions: Fall Restrictions Weight Bearing Restrictions: No   Pertinent Vitals/Pain No c/o pain    ADL  Grooming: Performed;Wash/dry face;Wash/dry hands;Min guard;Minimal assistance Where Assessed - Grooming: Supported sitting Upper Body Bathing: Simulated;Minimal assistance Lower Body Bathing: +1 Total assistance;Simulated Upper Body Dressing: Performed;Minimal assistance Lower Body Dressing: +1 Total assistance Toilet Transfer: Performed;+2 Total assistance Toilet Transfer Method: Sit to stand;Stand pivot Acupuncturist: Bedside commode Toileting - Clothing Manipulation and Hygiene: +1 Total assistance Tub/Shower Transfer Method: Not assessed Equipment Used: Gait belt;Rolling walker;Other (comment) (BSC) Transfers/Ambulation Related to ADLs: cues for correct hand  placement, safety ADL Comments: Pt requirs extensive assist with ADLs, cues    OT Diagnosis: Generalized weakness;Acute pain  OT Problem List: Decreased strength;Decreased knowledge of use of DME or AE OT Treatment Interventions: Therapeutic exercise;Self-care/ADL training;Patient/family education;Neuromuscular education;Balance training;Therapeutic activities;DME and/or AE instruction   OT Goals(Current goals can be found in the care plan section) Acute Rehab OT Goals Patient Stated Goal: To do better OT Goal Formulation: With patient Time For Goal Achievement: 02/28/13 Potential to Achieve Goals: Good ADL Goals Pt Will Perform Grooming: with min guard assist;with set-up;with supervision;sitting Pt Will Perform Upper Body Bathing: with min guard assist;sitting;with supervision;with set-up Pt Will Perform Lower Body Bathing: with max assist;with mod assist;sitting/lateral leans Pt Will Perform Upper Body Dressing: with min guard assist;with supervision;with set-up;sitting Pt Will Transfer to Toilet: with total assist;with max assist;bedside commode  Visit Information  Last OT Received On: 02/21/13 Assistance Needed: +2 History of Present Illness: Pt is a 79 y/o male admitted s/p fall from w/c with head contusion.       Prior Functioning     Home Living Family/patient expects to be discharged to:: Other (Comment) (alf) Available Help at Discharge: Personal care attendant;Available PRN/intermittently Type of Home: Assisted living Home Access: Level entry Home Layout: One level Home Equipment: Wheelchair - [employer] - 2 wheels Additional Comments: Pt with some confusion about how much assist he needed prior to hospitalization and currently Prior Function Level of Independence: Independent with assistive device(s) Comments: pt unsure abour how much asssist he needed PTA, but states that he has help in the monrings Communication Communication: No difficulties Dominant Hand:  Right         Vision/Perception Vision - History Baseline Vision: Wears glasses all the time Patient Visual Report: No change from baseline Perception Perception: Within Functional Limits   Cognition  Cognition Arousal/Alertness: Awake/alert Behavior During Therapy: WFL for tasks assessed/performed Overall Cognitive Status: No family/caregiver present to determine baseline cognitive functioning Memory: Decreased short-term memory    Extremity/Trunk Assessment Upper Extremity Assessment Upper Extremity Assessment: Overall WFL for tasks assessed;Generalized weakness Lower Extremity Assessment Lower Extremity Assessment: Defer to PT evaluation Cervical / Trunk Assessment Cervical / Trunk Assessment: Kyphotic     Mobility Bed Mobility Bed Mobility: Supine to Sit;Sitting - Scoot to [place] of Bed;Sit to Supine;Scooting to [facility] Supine to Sit: 2: Max assist Sitting - Scoot to [place] of Bed: 2: Max assist Sit to Supine: 3: Mod assist Scooting to HOB: 2: Max assist Transfers Sit to Stand: 1: +1 Total assist;From chair/3-in-1;With armrests;From bed Stand to Sit: 3: Mod assist;To chair/3-in-1;With armrests;To bed Details for Transfer Assistance: Pt required increased cueing for hand placement and safety awareness prior to initiating transfers.     Exercise     Balance Balance Balance Assessed: Yes Dynamic Sitting Balance Dynamic Sitting - Balance Support: During functional activity;No upper extremity supported;Feet supported Dynamic Sitting - Level of Assistance: 4: Min assist Static Standing Balance Static Standing - Comment/# of Minutes: pt leans backwards with dynamic activity   End of Session OT - End of Session Equipment Utilized During Treatment: Gait belt;Rolling walker;Other (comment) (BSC) Activity Tolerance: Patient limited by fatigue Patient left: in bed;with call bell/phone within reach;with bed alarm set  GO     Galen Manila 02/21/2013, 11:19 AM

## 2013-02-22 ENCOUNTER — Inpatient Hospital Stay (HOSPITAL_COMMUNITY): Payer: Medicare Other

## 2013-02-22 DIAGNOSIS — R42 Dizziness and giddiness: Secondary | ICD-10-CM | POA: Diagnosis present

## 2013-02-22 LAB — URINE MICROSCOPIC-ADD ON

## 2013-02-22 LAB — CBC
HCT: 28.7 % — ABNORMAL LOW (ref 39.0–52.0)
Hemoglobin: 9.5 g/dL — ABNORMAL LOW (ref 13.0–17.0)
MCH: 27.8 pg (ref 26.0–34.0)
MCV: 83.9 fL (ref 78.0–100.0)
Platelets: 345 10*3/uL (ref 150–400)
RBC: 3.42 MIL/uL — ABNORMAL LOW (ref 4.22–5.81)
WBC: 8 10*3/uL (ref 4.0–10.5)

## 2013-02-22 LAB — TYPE AND SCREEN: ABO/RH(D): O POS

## 2013-02-22 LAB — BASIC METABOLIC PANEL
BUN: 15 mg/dL (ref 6–23)
CO2: 25 mEq/L (ref 19–32)
Calcium: 8.1 mg/dL — ABNORMAL LOW (ref 8.4–10.5)
Chloride: 102 mEq/L (ref 96–112)
Creatinine, Ser: 0.62 mg/dL (ref 0.50–1.35)
Glucose, Bld: 110 mg/dL — ABNORMAL HIGH (ref 70–99)

## 2013-02-22 LAB — URINALYSIS, ROUTINE W REFLEX MICROSCOPIC
Glucose, UA: NEGATIVE mg/dL
Hgb urine dipstick: NEGATIVE
Nitrite: NEGATIVE
Protein, ur: NEGATIVE mg/dL
Specific Gravity, Urine: 1.021 (ref 1.005–1.030)
Urobilinogen, UA: 1 mg/dL (ref 0.0–1.0)

## 2013-02-22 MED ORDER — MUSCLE RUB 10-15 % EX CREA
TOPICAL_CREAM | Freq: Two times a day (BID) | CUTANEOUS | Status: DC
  Administered 2013-02-22: 23:00:00 via TOPICAL
  Administered 2013-02-22: 1 via TOPICAL
  Administered 2013-02-23: 11:00:00 via TOPICAL
  Filled 2013-02-22: qty 85

## 2013-02-22 MED ORDER — BARRIER CREAM NON-SPECIFIED: 1.0000 "application " | TOPICAL_CREAM | TOPICAL | Status: DC | PRN

## 2013-02-22 MED ORDER — MECLIZINE HCL 12.5 MG PO TABS
12.5000 mg | ORAL_TABLET | Freq: Three times a day (TID) | ORAL | Status: DC
  Administered 2013-02-22 – 2013-02-23 (×3): 12.5 mg via ORAL
  Filled 2013-02-22 (×5): qty 1

## 2013-02-22 MED ORDER — ZINC OXIDE 11.3 % EX CREA
TOPICAL_CREAM | CUTANEOUS | Status: DC | PRN
  Filled 2013-02-22: qty 56

## 2013-02-22 MED ORDER — INDOMETHACIN 25 MG PO CAPS
25.0000 mg | ORAL_CAPSULE | Freq: Two times a day (BID) | ORAL | Status: DC
  Administered 2013-02-22 – 2013-02-23 (×3): 25 mg via ORAL
  Filled 2013-02-22 (×4): qty 1

## 2013-02-22 MED ORDER — TROLAMINE SALICYLATE 10 % EX CREA: TOPICAL_CREAM | Freq: Two times a day (BID) | CUTANEOUS | Status: DC

## 2013-02-22 NOTE — Care Management Note (Signed)
Noted consult for [facility], PT/OT and aide. CM aware that pt is resident of [facility] assisted Living. In this CM experience this ALF has contract with a specific agency for PT/OT. Insurance would not cover [facility] unless there is a specific skilled need that can not be managed by the ALF staff ( such as IV antibiotic, complex dressing etc). If this is the case again [facility] has preferences as to the agency used and this CM would speak with that facility first.  A HH aide, would need to be arranged by the family as insurance would not cover an aide for a pt in a facility.  Will continue to follow and setup assistance as specific needs are identified.  Johny Shock RN MPH, case manager, [phone]

## 2013-02-22 NOTE — Consult Note (Addendum)
WOC consult Note Reason for Consult: Consult requested for sacrum and buttock wounds.  Pt is incontinent of urine at this time. Difficult to keep wounds from becoming soiled. Wound type:2 areas of stage 2 wounds Pressure Ulcer POA: Yes Measurement: sacrum .6X.6X.1cm, inner buttocks .3X.2X.1cm Wound bed: both sites pink and moist  Drainage (amount, consistency, odor) no odor, minimal yellow drainage. Periwound:white macerated edges consistent with frequent moisture. Dressing procedure/placement/frequency: Foam dressing to protect and promote healing.  Barrier cream to repel moisture if foam dressings becoming frequently soiled. Educational handout given regarding pressure ulcer information. Discussed pressure ulcer etiology, topical treatment, and preventive measures with patient. Does not appear to understand and was not aware that he had any wounds to these areas.  Please re-consult if further assistance is needed.  Thank-you,  Cammie Mcgee MSN, RN, CWOCN, [place], CNS [phone]

## 2013-02-22 NOTE — Progress Notes (Signed)
[facility] PROGRESS NOTE  Tye Vigo WUJ:[id number] DOB: 24-Dec-1916 DOA: 02/19/2013 PCP: No primary provider on file.  Assessment/Plan:  Fall from wheelchair with head contusion Frequent falls recently. CT Head negative for bleed or acute changes PT / OT evaluation recommends SNF.  MD & PA-C endorsed SNF to POA and family.  Family politely refuses SNF.  They want him back at ALF with increased support.   Dizziness Patient complains of room tilting BPPV vs progression of chronic dementia. PT unable to perform vestibular rehab as 79 yo patient has curvature of the spine with neck stiffness PT only able to check orthostatics lying and sitting as patient is unable to stand. Added Meclizine. (Scheduled for now, will change to PRN if it helps)   Bradycardia approx 48 bpm while sleeping and awake sitting in chair Not on rate control medication BP stable. Will monitor.   Fever Uncertain etiology Low grade (100.1 on 10/3) Repeat CXR is clear Repeat U/A shows 7-10 wbc, small Leukocytes, and rare bacteria.   Already on Rocephin  Second set of Bld cx pending.  1 set shows no growth to date. Rocephin/Azith were started in the ED.  Stopped on 10/1, then restarted on (10/2) as the patient appears more confused and developed a low grade temp overnight.   Diarrhea History of Ulcerative Colitis on sulfasalazine RN reports stools are loose and tarry colored Will check c-diff PCR. Change miralax and colace to PRN   Anemia Hgb rose to 9.5 after being transfused 1 unit of PRBC on 10/2 No baseline in EPIC. Guiac negative. Outpatient follow up recommended.   Dementia Pleasantly demented Demonstrates sundowning from approximately 6 pm - 8 am Otherwise stable.  Hand Pain No fractures or structural abnormality on Xray Aspercreme Check serum uric acid level on 10/4 am Started indocin bid with meals.   DVT Prophylaxis:  SCDs  Code Status: Patient has become DNR this  admission. Family Communication: Brother in Social worker and nephew at bedside. Disposition Plan: inpatient. Family and Social work prepared for D/C on 10/4 if medically stable.   Antibiotics:  Azith / Rocephin on 9/30 and again on 10/2  HPI/Subjective: Mr. Broadwell mildly confused today.  Slumps to the left. States everything makes him unhappy (then grins at me).  Complains of hand pain in his right snuff box area.  Objective: Filed Vitals:   02/21/13 2215 02/21/13 2316 02/22/13 0617 02/22/13 0731  BP: 115/54 118/60 137/67 116/50  Pulse: 61 63 66 48  Temp: 98.4 F (36.9 C) 98.5 F (36.9 C) 99.6 F (37.6 C) 100.1 F (37.8 C)  TempSrc: Oral Oral Oral Oral  Resp: 20 20 20 18   Height:      Weight:      SpO2: 96% 95% 91% 92%    Intake/Output Summary (Last 24 hours) at 02/22/13 1457 Last data filed at 02/22/13 1256  Gross per 24 hour  Intake   1120 ml  Output   1025 ml  Net     95 ml   Filed Weights   02/19/13 1933 02/20/13 0130  Weight: 63.504 kg (140 lb) 64.7 kg (142 lb 10.2 oz)    Exam:   General:  A&O, NAD, Lying in bed slumped left.  Does not open eyes. Lesion on forehead decreasing in size.  Cardiovascular: RRR, no murmurs, rubs or gallops, 1+ bilateral lower ext swelling.  Respiratory: CTA, no wheeze, crackles, or rales.  No increased work of breathing.  Abdomen: Soft, non-tender, non-distended, + bowel sounds, no masses  Musculoskeletal: Able to move all 4 extremities, hands are both slightly swollen and tender to grip.  Data Reviewed: Basic Metabolic Panel:  Recent Labs Lab 02/19/13 2028 02/22/13 0507  NA 138 136  K 4.1 3.8  CL 102 102  CO2 25 25  GLUCOSE 116* 110*  BUN 21 15  CREATININE 0.80 0.62  CALCIUM 8.2* 8.1*   Liver Function Tests:  Recent Labs Lab 02/19/13 2028  AST 30  ALT 25  ALKPHOS 159*  BILITOT 0.3  PROT 7.6  ALBUMIN 2.5*   CBC:  Recent Labs Lab 02/19/13 2028 02/21/13 0530 02/22/13 0507  WBC 7.1 7.8 8.0  NEUTROABS 5.0   --   --   HGB 8.7* 8.6* 9.5*  HCT 26.8* 26.3* 28.7*  MCV 84.8 85.1 83.9  PLT 342 344 345    Recent Results (from the past 240 hour(s))  CULTURE, BLOOD (ROUTINE X 2)     Status: None   Collection Time    02/20/13 12:15 AM      Result Value Range Status   Specimen Description BLOOD RIGHT ARM   Final   Special Requests BOTTLES DRAWN AEROBIC AND ANAEROBIC 10CC EACH   Final   Culture  Setup Time     Final   Value: 02/20/2013 04:39     Performed at [facility]   Culture     Final   Value:        BLOOD CULTURE RECEIVED NO GROWTH TO DATE CULTURE WILL BE HELD FOR 5 DAYS BEFORE ISSUING A FINAL NEGATIVE REPORT     Performed at [facility]   Report Status PENDING   Incomplete  CULTURE, BLOOD (ROUTINE X 2)     Status: None   Collection Time    02/20/13 12:25 AM      Result Value Range Status   Specimen Description BLOOD LEFT HAND   Final   Special Requests BOTTLES DRAWN AEROBIC ONLY 10CC   Final   Culture  Setup Time     Final   Value: 02/20/2013 04:39     Performed at [facility]   Culture     Final   Value:        BLOOD CULTURE RECEIVED NO GROWTH TO DATE CULTURE WILL BE HELD FOR 5 DAYS BEFORE ISSUING A FINAL NEGATIVE REPORT     Performed at [facility]   Report Status PENDING   Incomplete  MRSA PCR SCREENING     Status: Abnormal   Collection Time    02/20/13  1:37 AM      Result Value Range Status   MRSA by PCR POSITIVE (*) NEGATIVE Final   Comment:            The GeneXpert MRSA Assay (FDA     approved for NASAL specimens     only), is one component of a     comprehensive MRSA colonization     surveillance program. It is not     intended to diagnose MRSA     infection nor to guide or     monitor treatment for     MRSA infections.     RESULT CALLED TO, READ BACK BY AND VERIFIED WITH:     MILAM,T RN [id number] AT 0348 SKEEN,P     Studies: Dg Chest 2 View  02/22/2013   CLINICAL DATA:  Fever. Rule out pneumonia  EXAM: CHEST  2 VIEW  COMPARISON:   02/19/2013  FINDINGS: Streaky markings in the lung  bases show partial clearing consistent with atelectasis and scarring. Negative for pneumonia. Negative for heart failure or effusion.  IMPRESSION: Improved aeration in the lung bases. Negative for pneumonia.   Electronically Signed   By: Marlan Palau M.D.   On: 02/22/2013 12:04    Scheduled Meds: . acetaminophen  500 mg Oral TID  . artificial tears  1 application Right Eye BID  . aspirin  81 mg Oral Daily  . azithromycin  500 mg Intravenous Q24H  . cefTRIAXone (ROCEPHIN)  IV  1 g Intravenous Q24H  . Chlorhexidine Gluconate Cloth  6 each Topical Q0600  . cyanocobalamin  500 mcg Oral Daily  . diclofenac sodium  2 g Topical Daily  . feeding supplement  237 mL Oral BID BM  . ferrous sulfate  325 mg Oral TID WC  . folic acid  400 mcg Oral Daily  . indomethacin  25 mg Oral BID WC  . levothyroxine  25 mcg Oral QAC breakfast  . meclizine  12.5 mg Oral TID  . mupirocin ointment  1 application Nasal BID  . MUSCLE RUB   Topical BID  . polyvinyl alcohol  1 drop Right Eye TID  . sertraline  50 mg Oral Daily  . sulfaSALAzine  1,000 mg Oral BID  . tamsulosin  0.4 mg Oral Daily   Continuous Infusions: . sodium chloride 20 mL/hr at 02/22/13 1223    Principal Problem:   CAP (community acquired pneumonia) Active Problems:   Arthritis   Anemia   Falling   Dizzy    [place]  [facility] Pager [phone]. If 7PM-7AM, please contact night-coverage at [email or web address], password [facility] 02/22/2013, 2:57 PM  LOS: 3 days

## 2013-02-22 NOTE — Progress Notes (Signed)
Physical Therapy Treatment Patient Details Name: Jacob Ortega MRN: [id number] DOB: Jan 30, 1917 Today's Date: 02/22/2013 Time: 0901-1002 PT Time Calculation (min): 61 min  PT Assessment / Plan / Recommendation  History of Present Illness Pt is a 79 y/o male admitted s/p fall from w/c with head contusion.   PT Comments   Pt showing marked decrease in ability to perform functional mobility this session. He was unable to come to full stand with max assist +2 with RW. Pt reports increased dizziness especially last night, but states that he feels he is falling forward when he leans forward to initiate sit>stand transfer. BPPV a possibility, however pt is kyphotic and has limited AROM of cervical spine which would make testing difficult. Spoke with family regarding benefits of SNF placement vs. 24 hr care vs. returning to ALF.  Follow Up Recommendations  SNF     Does the patient have the potential to tolerate intense rehabilitation     Barriers to Discharge        Equipment Recommendations  None recommended by PT    Recommendations for Other Services    Frequency Min 3X/week   Progress towards PT Goals Progress towards PT goals: Progressing toward goals  Plan Current plan remains appropriate    Precautions / Restrictions Precautions Precautions: Fall Restrictions Weight Bearing Restrictions: No   Pertinent Vitals/Pain Pt reports no pain during session.    Mobility  Bed Mobility Bed Mobility: Supine to Sit;Sitting - Scoot to Edge of Bed Supine to Sit: 3: Mod assist Sitting - Scoot to Edge of Bed: 3: Mod assist Details for Bed Mobility Assistance: VC's for sequencing and hand placement during transfer to EOB. Pt appeared to have difficulty with trunk stability and assist was mainly to help patient lean forward. Transfers Transfers: Sit to Stand;Stand to Sit;Lateral/Scoot Transfers Sit to Stand: 1: +2 Total assist;From chair/3-in-1;With armrests Sit to Stand: Patient Percentage:  10% Stand to Sit: 2: Max assist;To bed;With upper extremity assist Lateral/Scoot Transfers: 1: +2 Total assist;With armrests removed Lateral Transfers: Patient Percentage: 10% Details for Transfer Assistance: Pt unable to come to full stand with 5 attempts. Pt was able to scoot laterally to the right from the bed to the chair, without the use of a slide board (slide board attempted but pt did better without it). During sit>stand, pt had difficulty keeping feet on floor and tried to step forward with R foot before pt was fully standing. Ambulation/Gait Ambulation/Gait Assistance: Other (comment) (Unable at this time.) Wheelchair Mobility Wheelchair Mobility: No    Exercises     PT Diagnosis:    PT Problem List:   PT Treatment Interventions:     PT Goals (current goals can now be found in the care plan section) Acute Rehab PT Goals Patient Stated Goal: To walk better PT Goal Formulation: With patient Time For Goal Achievement: 02/27/13 Potential to Achieve Goals: Fair  Visit Information  Last PT Received On: 02/22/13 Assistance Needed: +2 History of Present Illness: Pt is a 79 y/o male admitted s/p fall from w/c with head contusion.    Subjective Data  Subjective: "It felt like the world tipped upside down last night." Patient Stated Goal: To walk better   Cognition  Cognition Arousal/Alertness: Awake/alert Behavior During Therapy: Flat affect Overall Cognitive Status: History of cognitive impairments - at baseline Memory: Decreased short-term memory    Balance  Balance Balance Assessed: Yes Static Sitting Balance Static Sitting - Balance Support: Bilateral upper extremity supported;Feet supported Static Sitting - Level of  Assistance: 3: Mod assist;4: Min Therapist, music Sitting - Comment/# of Minutes: 8 Dynamic Sitting Balance Dynamic Sitting - Balance Support: During functional activity;Feet supported Dynamic Sitting - Level of Assistance: 3: Mod assist  End of Session  PT - End of Session Equipment Utilized During Treatment: Gait belt Activity Tolerance: Patient limited by fatigue Patient left: in chair;with call bell/phone within reach;with family/visitor present Nurse Communication: Mobility status   GP     Ruthann Cancer 02/22/2013, 12:28 PM  Ruthann Cancer, PT, DPT Acute Rehabilitation Services

## 2013-02-22 NOTE — Progress Notes (Signed)
Addendum  Patient seen and examined, chart and data base reviewed.  I agree with the above assessment and plan.  For full details please see Mrs. Algis Downs PA note.  Continues to have low-grade fever despite being on Rocephin and azithromycin empirically.  Cultures repeated including blood, urine and chest x-ray.  ? Inflammation rather than infection, has bilateral hand swelling, started on indomethacin.   Clint Lipps, MD [facility] Pager: [phone] 02/22/2013, 4:02 PM

## 2013-02-22 NOTE — Clinical Social Work Psychosocial (Signed)
Clinical Social Work Department BRIEF PSYCHOSOCIAL ASSESSMENT 02/22/2013  Patient:  Jacob Ortega, Jacob Ortega     Account Number:  [id number]     Admit date:  02/19/2013  Clinical Social Worker:  Delmer Islam  Date/Time:  02/22/2013 02:28 AM  Referred by:  Physician  Date Referred:  02/20/2013 Referred for  ALF Placement   Other Referral:   Interview type:  Family Other interview type:   CSW talked with relative Jacob Ortega at [phone]    PSYCHOSOCIAL DATA Living Status:  FACILITY Admitted from facility:  [facility] Level of care:  Assisted Living Primary support name:  Jacob Ortega Primary support relationship to patient:  CHILD, ADULT Degree of support available:   Family also incluedes Jacob Ortega and Jacob Ortega.    CURRENT CONCERNS Current Concerns  Post-Acute Placement   Other Concerns:    SOCIAL WORK ASSESSMENT / PLAN Patient is from [facility] and will return per family. CSW talked by phone with Jacob Ortega to advise of Saturday (10/4) discharge. CSW also spoke with Jacob Ortega, resident care coordinator to inform her of 10/4 discharge and clinical information sent to facility and reviewed.   Assessment/plan status:  No Further Intervention Required Other assessment/ plan:   Information/referral to community resources:   Family and facility aware of Saturday discharge. Family and [place] with facility talked with nurse case manager regarding home health needs. Family appreciative of CSW contact.    PATIENT'S/FAMILY'S RESPONSE TO PLAN OF CARE:

## 2013-02-23 ENCOUNTER — Inpatient Hospital Stay (HOSPITAL_COMMUNITY): Payer: Medicare Other

## 2013-02-23 DIAGNOSIS — M129 Arthropathy, unspecified: Secondary | ICD-10-CM

## 2013-02-23 DIAGNOSIS — W19XXXA Unspecified fall, initial encounter: Secondary | ICD-10-CM

## 2013-02-23 DIAGNOSIS — R509 Fever, unspecified: Secondary | ICD-10-CM

## 2013-02-23 DIAGNOSIS — J189 Pneumonia, unspecified organism: Principal | ICD-10-CM

## 2013-02-23 MED ORDER — MECLIZINE HCL 12.5 MG PO TABS: 12.5000 mg | ORAL_TABLET | Freq: Three times a day (TID) | ORAL | Status: DC

## 2013-02-23 MED ORDER — MECLIZINE HCL 12.5 MG PO TABS: 12.5000 mg | ORAL_TABLET | Freq: Three times a day (TID) | ORAL | Status: AC | PRN

## 2013-02-23 NOTE — Progress Notes (Signed)
Pt for return to [facility] at [facility].  CSW faxed pt discharge information to ALF and spoke to ALF who plans to review and notify this CSW that transport can be arranged.   CSW to facilitate pt discharge needs once call back received from ALF.  Jacklynn Lewis, MSW, [employer]  Clinical Social Work Weekend coverage [phone]

## 2013-02-23 NOTE — Progress Notes (Signed)
Pt for discharge back to [facility] at [facility].  CSW facilitated pt discharge needs including contacting facility, faxing pt discharge information to facility and confirming facility received and reviewed information, discussed with pt and pt family member at bedside who reported that they have arranged sitters for the facility in order for pt to have increased care at ALF. CSW confirmed that RN called report. CSW arranged ambulance transport for pt back to [facility] at [facility].  No further social work needs identified at this time.  CSW signing off.   Jacklynn Lewis, MSW, LCSWA  Clinical Social Work [phone]

## 2013-02-23 NOTE — Discharge Summary (Signed)
Physician Discharge Summary  Jacob Ortega ZOX:[id number] DOB: 1917-03-05 DOA: 02/19/2013  PCP: No primary provider on file.  Admit date: 02/19/2013 Discharge date: 02/23/2013  Time spent: *50 minutes  Recommendations for Outpatient Follow-up:  1. Follow up PCP in 2 weeks  Discharge Diagnoses:  Principal Problem:   CAP (community acquired pneumonia) Active Problems:   Arthritis   Anemia   Falling   Dizzy   Discharge Condition: Stable  Diet recommendation: Low salt diet  Filed Weights   02/19/13 1933 02/20/13 0130  Weight: 63.504 kg (140 lb) 64.7 kg (142 lb 10.2 oz)    History of present illness:  79 y.o. male who presents to the ED after a fall from a wheelchair. He landed on his forehead. While work up in the ED demonstrated no major injury other than a hematoma, this is his 3rd fall in the past 3 days. While in the ED he was also noted to be running a fever of 100.6, workup ensued and he is suspected of having an occult PNA based on CXR and hypoxia in the ED. He was started on rocephin and azithromycin and hospitalist has been asked to admit   Hospital Course:  Fall from wheelchair with head contusion  Frequent falls recently.  CT Head negative for bleed or acute changes  PT / OT evaluation recommends SNF. MD & PA-C endorsed SNF to POA and family. Family politely refuses SNF. They want him back at ALF with increased support.   Dizziness  Patient complains of room tilting  BPPV vs progression of chronic dementia.  PT unable to perform vestibular rehab as 79 yo patient has curvature of the spine with neck stiffness  PT only able to check orthostatics lying and sitting as patient is unable to stand.  Added Meclizine prn for dizziness  Bradycardia  approx 48 bpm while sleeping and awake sitting in chair  Not on rate control medication  BP stable.   Fever  Uncertain etiology ? Autoimmune, he has h/o Ulcerative colitis Low grade (100.1 on 10/3)  Repeat CXR is clear   Repeat U/A shows 7-10 wbc, small Leukocytes, and rare bacteria.  Was started on Rocephin and zithromax, and has completed five days of antibiotics. - Will d/c the antibiotics at this time.  Diarrhea  - Resolved -History of Ulcerative Colitis on sulfasalazine     Anemia  Hgb rose to 9.5 after being transfused 1 unit of PRBC on 10/2  No baseline in EPIC.  Guiac negative.  Outpatient follow up recommended.   Dementia  Pleasantly demented  Demonstrates sundowning from approximately 6 pm - 8 am  Otherwise stable.   Hand Pain  No fractures or structural abnormality on Xray  Uric acid level is 4.4 Was started on indomethacin with not much response - Will continue with Tylenol prn      Procedures:  None  Consultations:  None  Discharge Exam: Filed Vitals:   02/23/13 0800  BP: 122/82  Pulse: 91  Temp: 98.4 F (36.9 C)  Resp: 18    General: Appear in no acute distress Cardiovascular: S1s2 RRR Respiratory: Clear bilaterally Ext: Mild warmth in wrists , no erythema, no swelling  Discharge Instructions  Discharge Orders   Future Orders Complete By Expires   Diet - low sodium heart healthy  As directed    Increase activity slowly  As directed        Medication List         acetaminophen 500 MG tablet  Commonly  known as:  TYLENOL  Take 500 mg by mouth 3 (three) times daily.     artificial tears Oint ophthalmic ointment  Place 1 application into the right eye 2 (two) times daily.     aspirin 81 MG chewable tablet  Chew 81 mg by mouth daily.     BAZA PROTECT EX  Apply 1 application topically 2 (two) times daily.     cyanocobalamin 500 MCG tablet  Take 500 mcg by mouth daily.     diclofenac sodium 1 % Gel  Commonly known as:  VOLTAREN  Apply 2 g topically daily. To right knee and left hand     docusate sodium 100 MG capsule  Commonly known as:  COLACE  Take 100 mg by mouth daily.     ENSURE PLUS Liqd  Take 237 mLs by mouth 2 (two) times daily  between meals.     ferrous sulfate 325 (65 FE) MG EC tablet  Take 325 mg by mouth 3 (three) times daily with meals.     folic acid 400 MCG tablet  Commonly known as:  FOLVITE  Take 400 mcg by mouth daily.     levothyroxine 25 MCG tablet  Commonly known as:  SYNTHROID, LEVOTHROID  Take 25 mcg by mouth daily before breakfast.     meclizine 12.5 MG tablet  Commonly known as:  ANTIVERT  Take 1 tablet (12.5 mg total) by mouth 3 (three) times daily.     polyethylene glycol powder powder  Commonly known as:  GLYCOLAX/MIRALAX  Take 17 g by mouth daily.     SALONPAS PAIN RELIEF PATCH EX  Apply 1 patch topically 2 (two) times daily.     sertraline 50 MG tablet  Commonly known as:  ZOLOFT  Take 50 mg by mouth daily.     sulfaSALAzine 500 MG tablet  Commonly known as:  AZULFIDINE  Take 1,000 mg by mouth 2 (two) times daily.     SYSTANE 0.4-0.3 % Soln  Generic drug:  Polyethyl Glycol-Propyl Glycol  Place 1 drop into the right eye 3 (three) times daily.     tamsulosin 0.4 MG Caps capsule  Commonly known as:  FLOMAX  Take 0.4 mg by mouth daily.       Not on File    The results of significant diagnostics from this hospitalization (including imaging, microbiology, ancillary and laboratory) are listed below for reference.    Significant Diagnostic Studies: Dg Chest 2 View  02/22/2013   CLINICAL DATA:  Fever. Rule out pneumonia  EXAM: CHEST  2 VIEW  COMPARISON:  02/19/2013  FINDINGS: Streaky markings in the lung bases show partial clearing consistent with atelectasis and scarring. Negative for pneumonia. Negative for heart failure or effusion.  IMPRESSION: Improved aeration in the lung bases. Negative for pneumonia.   Electronically Signed   By: Marlan Palau M.D.   On: 02/22/2013 12:04   Dg Chest 2 View  02/19/2013   CLINICAL DATA:  Multiple falls. Change in mental status.  EXAM: CHEST  2 VIEW  COMPARISON:  None.  FINDINGS: Heart is upper limits normal in size. Scarring or  atelectasis in the lung bases. Lungs otherwise clear. No effusions. No acute bony abnormality. Degenerative changes in the thoracic spine and shoulders.  IMPRESSION: Probable bibasilar scarring or atelectasis. No active cardiopulmonary disease.   Electronically Signed   By: Charlett Nose M.D.   On: 02/19/2013 22:55   Ct Head Wo Contrast  02/19/2013   CLINICAL DATA:  Larey Seat. Change  in mental status.  Forehead laceration.  EXAM: CT HEAD WITHOUT CONTRAST  TECHNIQUE: Contiguous axial images were obtained from the base of the skull through the vertex without intravenous contrast.  COMPARISON:  None.  FINDINGS: Age related cerebral atrophy, ventriculomegaly and periventricular white matter disease. No extra-axial fluid collections are identified. No CT findings for acute hemispheric infarction an or intracranial hemorrhage. Benign-appearing bilateral basal ganglia calcifications are noted. The brainstem and cerebellum are grossly normal. Mild cerebellar atrophy.  No acute skull fracture. The visualized facial bones are intact. The paranasal sinuses and mastoid air cells are clear. Minimal scattered ethmoid mucoperiosteal thickening.  IMPRESSION: Age related cerebral atrophy, ventriculomegaly and periventricular white matter disease.  No acute intracranial findings or skull fracture.   Electronically Signed   By: Loralie Champagne M.D.   On: 02/19/2013 21:22   Dg Hand Complete Left  02/20/2013   CLINICAL DATA:  Pain and swelling.  EXAM: LEFT HAND - COMPLETE 3+ VIEW  COMPARISON:  None.  FINDINGS: Diffuse osteoporosis.  No acute fracture or arthropathic findings.  IMPRESSION: No acute bony findings or significant degenerative changes.  Osteoporosis.   Electronically Signed   By: Loralie Champagne M.D.   On: 02/20/2013 00:07   Dg Hand Complete Right  02/20/2013   CLINICAL DATA:  Hand pain and swelling.  EXAM: RIGHT HAND - COMPLETE 3+ VIEW  COMPARISON:  None  FINDINGS: Diffuse osteoporosis. Mild degenerative changes. No  arthropathic findings. No acute fracture.  IMPRESSION: No acute bony findings or significant arthropathic changes.  Osteoporosis.   Electronically Signed   By: Loralie Champagne M.D.   On: 02/20/2013 00:07    Microbiology: Recent Results (from the past 240 hour(s))  CULTURE, BLOOD (ROUTINE X 2)     Status: None   Collection Time    02/20/13 12:15 AM      Result Value Range Status   Specimen Description BLOOD RIGHT ARM   Final   Special Requests BOTTLES DRAWN AEROBIC AND ANAEROBIC 10CC EACH   Final   Culture  Setup Time     Final   Value: 02/20/2013 04:39     Performed at [facility]   Culture     Final   Value:        BLOOD CULTURE RECEIVED NO GROWTH TO DATE CULTURE WILL BE HELD FOR 5 DAYS BEFORE ISSUING A FINAL NEGATIVE REPORT     Performed at [facility]   Report Status PENDING   Incomplete  CULTURE, BLOOD (ROUTINE X 2)     Status: None   Collection Time    02/20/13 12:25 AM      Result Value Range Status   Specimen Description BLOOD LEFT HAND   Final   Special Requests BOTTLES DRAWN AEROBIC ONLY 10CC   Final   Culture  Setup Time     Final   Value: 02/20/2013 04:39     Performed at [facility]   Culture     Final   Value:        BLOOD CULTURE RECEIVED NO GROWTH TO DATE CULTURE WILL BE HELD FOR 5 DAYS BEFORE ISSUING A FINAL NEGATIVE REPORT     Performed at [facility]   Report Status PENDING   Incomplete  MRSA PCR SCREENING     Status: Abnormal   Collection Time    02/20/13  1:37 AM      Result Value Range Status   MRSA by PCR POSITIVE (*) NEGATIVE Final  Comment:            The GeneXpert MRSA Assay (FDA     approved for NASAL specimens     only), is one component of a     comprehensive MRSA colonization     surveillance program. It is not     intended to diagnose MRSA     infection nor to guide or     monitor treatment for     MRSA infections.     RESULT CALLED TO, READ BACK BY AND VERIFIED WITH:     MILAM,T RN [id number] AT 0348  SKEEN,P     Labs: Basic Metabolic Panel:  Recent Labs Lab 02/19/13 2028 02/22/13 0507  NA 138 136  K 4.1 3.8  CL 102 102  CO2 25 25  GLUCOSE 116* 110*  BUN 21 15  CREATININE 0.80 0.62  CALCIUM 8.2* 8.1*   Liver Function Tests:  Recent Labs Lab 02/19/13 2028  AST 30  ALT 25  ALKPHOS 159*  BILITOT 0.3  PROT 7.6  ALBUMIN 2.5*   No results found for this basename: LIPASE, AMYLASE,  in the last 168 hours No results found for this basename: AMMONIA,  in the last 168 hours CBC:  Recent Labs Lab 02/19/13 2028 02/21/13 0530 02/22/13 0507  WBC 7.1 7.8 8.0  NEUTROABS 5.0  --   --   HGB 8.7* 8.6* 9.5*  HCT 26.8* 26.3* 28.7*  MCV 84.8 85.1 83.9  PLT 342 344 345   Cardiac Enzymes: No results found for this basename: CKTOTAL, CKMB, CKMBINDEX, TROPONINI,  in the last 168 hours BNP: BNP (last 3 results) No results found for this basename: PROBNP,  in the last 8760 hours CBG: No results found for this basename: GLUCAP,  in the last 168 hours     Signed:  Billie Intriago S  [facility] 02/23/2013, 1:13 PM

## 2013-02-23 NOTE — Progress Notes (Signed)
Patient to be discharged back to [facility] at [facility]. Report called. PIV removed. SW to arrange transportation. Family at bedside. Will monitor.  Kathlene November, Jamyra Zweig [place]

## 2013-02-26 LAB — CULTURE, BLOOD (ROUTINE X 2): Culture: NO GROWTH

## 2013-03-01 LAB — CULTURE, BLOOD (ROUTINE X 2): Culture: NO GROWTH

## 2013-05-23 ENCOUNTER — Emergency Department (HOSPITAL_COMMUNITY): Payer: Medicare Other

## 2013-05-23 ENCOUNTER — Encounter (HOSPITAL_COMMUNITY): Payer: Self-pay | Admitting: Emergency Medicine

## 2013-05-23 ENCOUNTER — Emergency Department (HOSPITAL_COMMUNITY)
Admission: EM | Admit: 2013-05-23 | Discharge: 2013-05-23 | Disposition: A | Discharge status: Home / Self Care | Payer: Medicare Other | Attending: Emergency Medicine | Admitting: Emergency Medicine

## 2013-05-23 DIAGNOSIS — Z7982 Long term (current) use of aspirin: Secondary | ICD-10-CM | POA: Insufficient documentation

## 2013-05-23 DIAGNOSIS — Y9210 Unspecified residential institution as the place of occurrence of the external cause: Secondary | ICD-10-CM | POA: Insufficient documentation

## 2013-05-23 DIAGNOSIS — Y9389 Activity, other specified: Secondary | ICD-10-CM | POA: Insufficient documentation

## 2013-05-23 DIAGNOSIS — K5190 Ulcerative colitis, unspecified, without complications: Secondary | ICD-10-CM | POA: Insufficient documentation

## 2013-05-23 DIAGNOSIS — Z043 Encounter for examination and observation following other accident: Secondary | ICD-10-CM | POA: Insufficient documentation

## 2013-05-23 DIAGNOSIS — Z79899 Other long term (current) drug therapy: Secondary | ICD-10-CM | POA: Insufficient documentation

## 2013-05-23 DIAGNOSIS — I10 Essential (primary) hypertension: Secondary | ICD-10-CM | POA: Insufficient documentation

## 2013-05-23 DIAGNOSIS — W1809XA Striking against other object with subsequent fall, initial encounter: Secondary | ICD-10-CM | POA: Insufficient documentation

## 2013-05-23 DIAGNOSIS — Z87891 Personal history of nicotine dependence: Secondary | ICD-10-CM | POA: Insufficient documentation

## 2013-05-23 DIAGNOSIS — R51 Headache: Secondary | ICD-10-CM | POA: Insufficient documentation

## 2013-05-23 DIAGNOSIS — E079 Disorder of thyroid, unspecified: Secondary | ICD-10-CM | POA: Insufficient documentation

## 2013-05-23 DIAGNOSIS — W19XXXA Unspecified fall, initial encounter: Secondary | ICD-10-CM

## 2013-05-23 DIAGNOSIS — Z87448 Personal history of other diseases of urinary system: Secondary | ICD-10-CM | POA: Insufficient documentation

## 2013-05-23 DIAGNOSIS — M129 Arthropathy, unspecified: Secondary | ICD-10-CM | POA: Insufficient documentation

## 2013-05-23 NOTE — ED Notes (Signed)
Bed: [id number] Expected date:  Expected time:  Means of arrival:  Comments: EMS--altered mental status

## 2013-05-23 NOTE — Discharge Instructions (Signed)
Patient status post multiple falls or in the night workup here without any evidence of any injuries. Head CT is negative x-rays of the pelvis and hips without any acute injury. Patient stable to discharge back to the nursing facility.

## 2013-05-23 NOTE — ED Notes (Signed)
Per EMS- Patient is a resident of [facility]. Staff reported a fall when transferring from wheelchair to toilet. No injuries noted. Staff reports that he has a history of falls, but did say that he had altered mental status, but were unable to say how.

## 2013-05-23 NOTE — ED Notes (Signed)
A call [facility] to [facility].

## 2013-05-23 NOTE — ED Notes (Signed)
Caregiver states that the patient is usually able to recite Presidents' names and day of week, but is unable to do so today. No injuries noted.

## 2013-05-23 NOTE — ED Provider Notes (Signed)
CSN: [id number]     Arrival date & time 05/23/13  4098 History   First MD Initiated Contact with Patient 05/23/13 [phone]     Chief Complaint  Patient presents with  . Fall   (Consider location/radiation/quality/duration/timing/severity/associated sxs/prior Treatment) Patient is a 80 y.o. male presenting with fall. The history is provided by the patient, the EMS personnel, the nursing home and a relative.  Fall Associated symptoms include headaches. Pertinent negatives include no chest pain, no abdominal pain and no shortness of breath.   patient brought in by EMS. Patient resident of [facility]. Patient had several falls during the night total of 3 last one occurred in the bathroom striking the back of his head. Patient occasionally has some confusion according to family members mental status is baseline. No reported history of loss of consciousness or syncope however some of the falls were unwitnessed. Patient did have a sitter at the bedside but she had fallen asleep with the 2 previous falls. Patient currently without any complaints. Patient is a DO NOT RESUSCITATE.  Past Medical History  Diagnosis Date  . Arthritis   . Thyroid disease   . Ulcerative colitis   . BPH (benign prostatic hypertrophy) with urinary obstruction   . Conjunctivitis   . Hypertension   . Pneumonia due to other specified bacteria(482.89)   . Lower extremity weakness   . Tinea pedis   . Edema    History reviewed. No pertinent past surgical history. History reviewed. No pertinent family history. History  Substance Use Topics  . Smoking status: Former Smoker    Types: Cigars  . Smokeless tobacco: Not on file  . Alcohol Use: No    Review of Systems  Constitutional: Negative for fever.  HENT: Negative for congestion.   Respiratory: Negative for shortness of breath.   Cardiovascular: Negative for chest pain.  Gastrointestinal: Negative for nausea, vomiting and abdominal pain.  Genitourinary: Negative for  hematuria.  Musculoskeletal: Negative for back pain.  Skin: Negative for rash.  Neurological: Positive for headaches. Negative for syncope.  Hematological: Does not bruise/bleed easily.  Psychiatric/Behavioral: Positive for confusion.    Allergies  Review of patient's allergies indicates no known allergies.  Home Medications   Current Outpatient Rx  Name  Route  Sig  Dispense  Refill  . acetaminophen (TYLENOL) 500 MG tablet   Oral   Take 500 mg by mouth 3 (three) times daily.         Marland Kitchen aspirin 81 MG chewable tablet   Oral   Chew 81 mg by mouth daily.         . Carboxymethylcellul-Glycerin (OPTIVE) 0.5-0.9 % SOLN   Ophthalmic   Apply 1 drop to eye 3 (three) times daily.         . carboxymethylcellulose (REFRESH CELLUVISC) 1 % ophthalmic solution   Both Eyes   Place 1 drop into both eyes at bedtime.         . citalopram (CELEXA) 10 MG tablet   Oral   Take 10 mg by mouth daily.         . cyanocobalamin 500 MCG tablet   Oral   Take 500 mcg by mouth daily.         . diclofenac sodium (VOLTAREN) 1 % GEL   Topical   Apply 2 g topically daily. To right knee and left hand         . docusate sodium (COLACE) 100 MG capsule   Oral   Take 100 mg by  mouth daily.         Marland Kitchen ENSURE PLUS (ENSURE PLUS) LIQD   Oral   Take 237 mLs by mouth 2 (two) times daily between meals.         . ferrous sulfate 325 (65 FE) MG EC tablet   Oral   Take 325 mg by mouth 3 (three) times daily with meals.         . folic acid (FOLVITE) 400 MCG tablet   Oral   Take 400 mcg by mouth daily.         . furosemide (LASIX) 40 MG tablet   Oral   Take 40 mg by mouth every morning.         Marland Kitchen guaifenesin (ROBITUSSIN) 100 MG/5ML syrup   Oral   Take 200 mg by mouth every 6 (six) hours as needed for cough.         . levothyroxine (SYNTHROID, LEVOTHROID) 25 MCG tablet   Oral   Take 25 mcg by mouth daily before breakfast.         . Liniments (SALONPAS PAIN RELIEF PATCH  EX)   Apply externally   Apply 1 patch topically 2 (two) times daily.         . meclizine (ANTIVERT) 12.5 MG tablet   Oral   Take 12.5 mg by mouth 3 (three) times daily as needed for dizziness.         . polyethylene glycol powder (GLYCOLAX/MIRALAX) powder   Oral   Take 17 g by mouth daily.         . Skin Protectants, Misc. (BAZA PROTECT EX)   Apply externally   Apply 1 application topically 2 (two) times daily.         Marland Kitchen sulfaSALAzine (AZULFIDINE) 500 MG tablet   Oral   Take 1,000 mg by mouth 2 (two) times daily.         . tamsulosin (FLOMAX) 0.4 MG CAPS capsule   Oral   Take 0.4 mg by mouth daily.         . traMADol (ULTRAM) 50 MG tablet   Oral   Take 50 mg by mouth every 6 (six) hours as needed for moderate pain.          BP 134/53  Pulse 72  Temp(Src) 98.3 F (36.8 C) (Oral)  Resp 16  SpO2 93% Physical Exam  Nursing note and vitals reviewed. Constitutional: He appears well-developed and well-nourished. No distress.  HENT:  Head: Normocephalic and atraumatic.  Mouth/Throat: Oropharynx is clear and moist.  Eyes: Conjunctivae and EOM are normal. Pupils are equal, round, and reactive to light.  Neck: Normal range of motion.  Cardiovascular: Normal rate, regular rhythm and normal heart sounds.   No murmur heard. Pulmonary/Chest: Effort normal and breath sounds normal. No respiratory distress.  Abdominal: Soft. Bowel sounds are normal. There is no tenderness.  Musculoskeletal: Normal range of motion.  Knee brace on the right leg for stabilization purposes.  Neurological: He is alert. No cranial nerve deficit. He exhibits normal muscle tone. Coordination normal.  Skin: Skin is warm. No rash noted.    ED Course  Procedures (including critical care time) Labs Review Labs Reviewed - No data to display Imaging Review Dg Hip Bilateral W/pelvis  05/23/2013   CLINICAL DATA:  Fall.  EXAM: BILATERAL HIP WITH PELVIS - 4+ VIEW  COMPARISON:  None.  FINDINGS:  There are mild symmetric degenerative changes of the hips. There is moderate degenerative change of the  spine. There is no acute fracture or dislocation. There is mild increased sclerosis with prominent trabecular pattern over the left acetabular region comment ischium, pubic rami and symphysis as this could be seen due to Paget's disease. There is minimal degenerative change over the sacroiliac joints. Remainder of the exam is unremarkable.  IMPRESSION: No acute findings.  Mild degenerative change of the hips.  Mild increased sclerosis with prominent trabecular pattern involving the left pelvis as described which can be seen in Paget's disease. Recommend clinical correlation.   Electronically Signed   By: Elberta Fortisaniel  Boyle M.D.   On: 05/23/2013 10:37   Ct Head Wo Contrast  05/23/2013   CLINICAL DATA:  Fall when transfer from wheelchair to toilet  EXAM: CT HEAD WITHOUT CONTRAST  TECHNIQUE: Contiguous axial images were obtained from the base of the skull through the vertex without intravenous contrast.  COMPARISON:  02/19/2013  FINDINGS: There is no evidence of mass effect, midline shift, or extra-axial fluid collections. There is no evidence of a space-occupying lesion or intracranial hemorrhage. There is no evidence of a cortical-based area of acute infarction. There is generalized cerebral atrophy. There is periventricular white matter low attenuation likely secondary to microangiopathy.  The ventricles and sulci are appropriate for the patient's age. The basal cisterns are patent.  Visualized portions of the orbits are unremarkable. There is right maxillary sinus air-fluid level. There is a small left maxillary sinus mucous retention cyst. Cerebrovascular atherosclerotic calcifications are noted.  The osseous structures are unremarkable.  IMPRESSION: No acute intracranial pathology.   Electronically Signed   By: Elige KoHetal  Patel   On: 05/23/2013 10:58    EKG Interpretation   None       MDM   1. Fall, initial  encounter     Patient status post several falls during the night despite having a sitter the latest fall was in the bathroom where he supposedly struck his head. Patient without any complaints. There's been some periods of disorientation in the past and experienced some of that last night but in general his mental status is baseline according to family members. Patient's head CT was negative for any injury and x-rays of the hips and pelvis were negative. Patient moving all extremities well as stated no specific complaints. Family satisfied with the workup patient will return to [facility] will return to the emergency department at any point in time as needed.    Shelda JakesScott W. Varonica Siharath, MD 05/24/13 [phone]

## 2013-11-20 DEATH — deceased

## 2014-06-25 IMAGING — CT CT HEAD W/O CM
1 series · 16 of 30 positions shown, 20 images · non-contrast
Comparison: None.

CLINICAL DATA: Fell. Change in mental status.  Forehead laceration.

EXAM:
CT HEAD WITHOUT CONTRAST
TECHNIQUE: Contiguous axial images were obtained from the base of the skull
through the vertex without intravenous contrast.

[Series 2: head 5.0 h30s · axial · 0.49mm/px · z∈[+1206,+1366]mm · 16 of 36 slices shown, 20 images]
[im 2/36  brain]
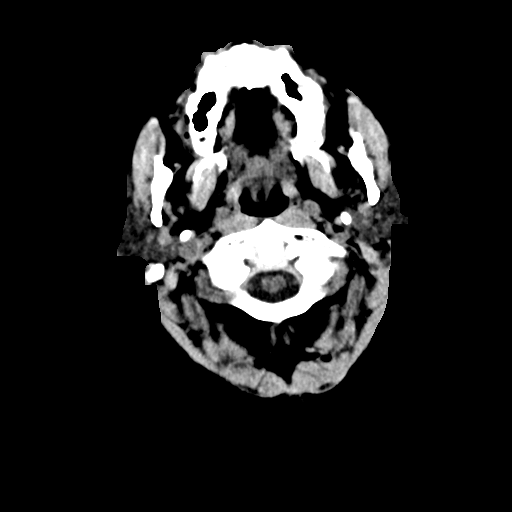
[im 2/36  bone]
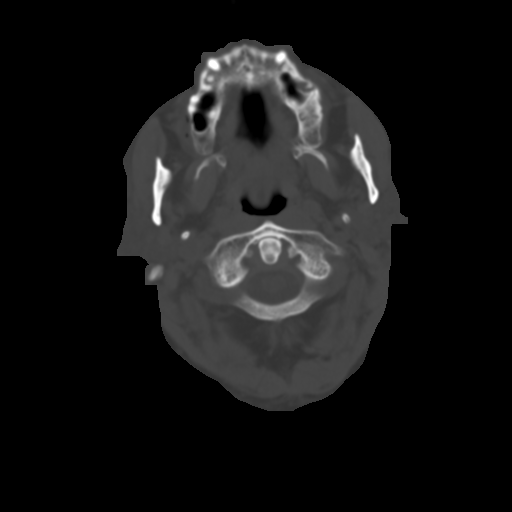
[im 4/36  brain]
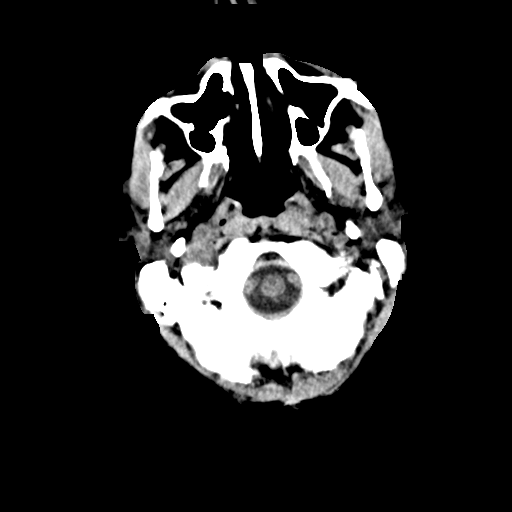
[im 7/36  brain]
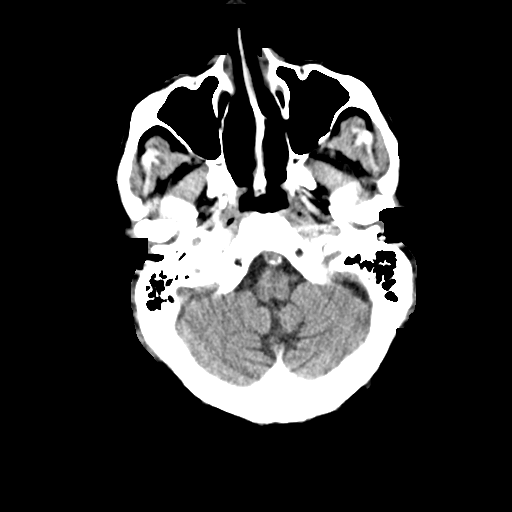
[im 9/36  brain]
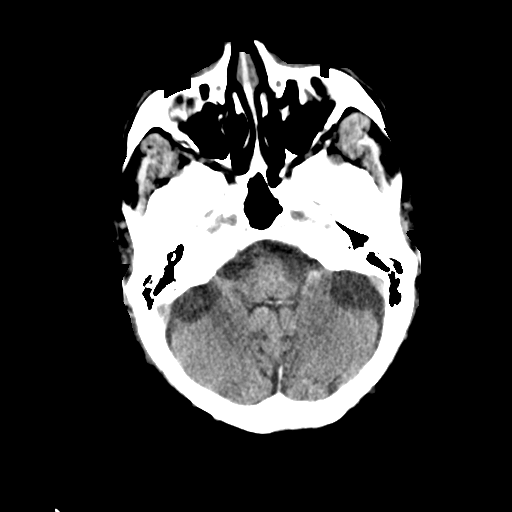
[im 10/36  brain]
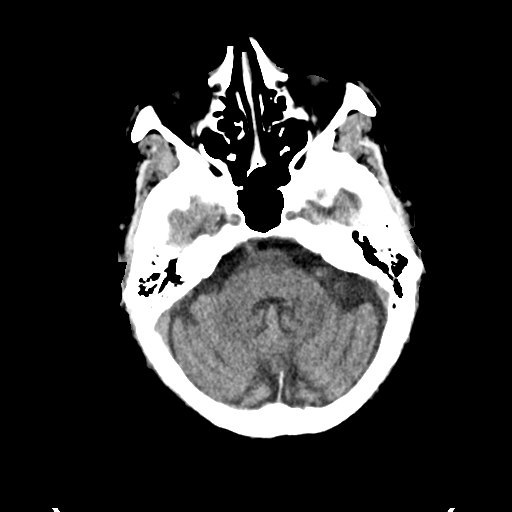
[im 10/36  bone]
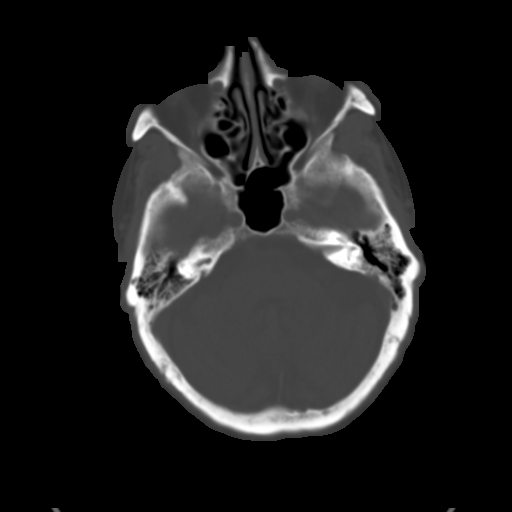
[im 13/36  brain]
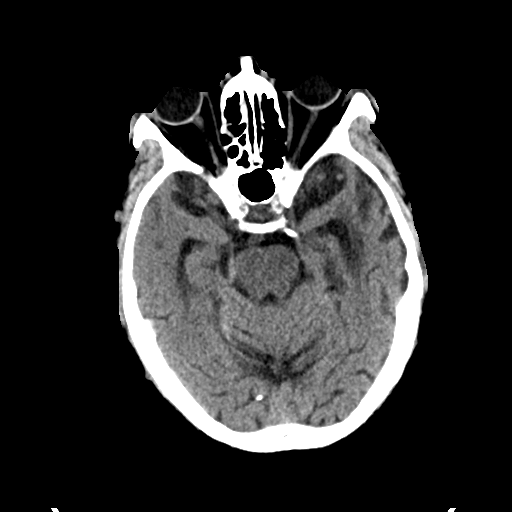
[im 15/36  brain]
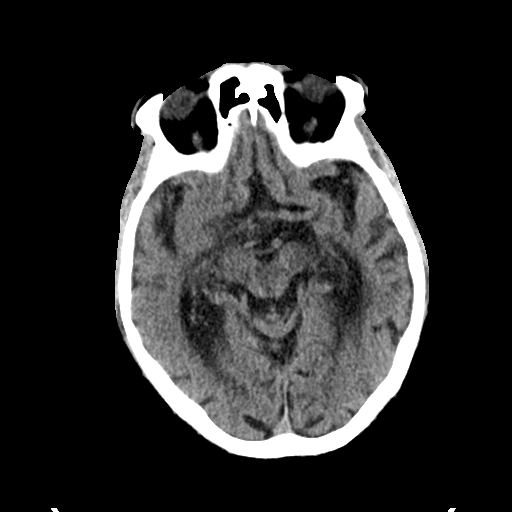
[im 17/36  brain]
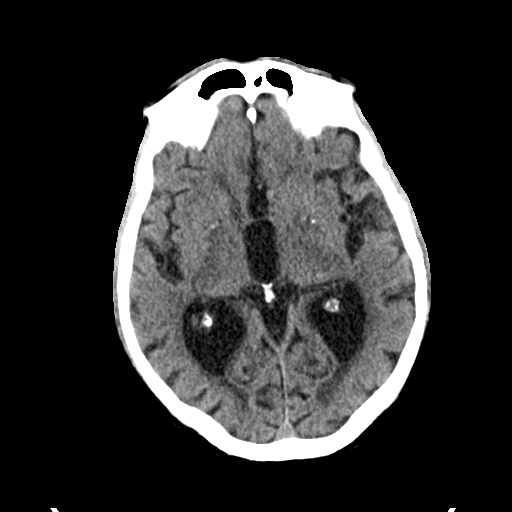
[im 19/36  brain]
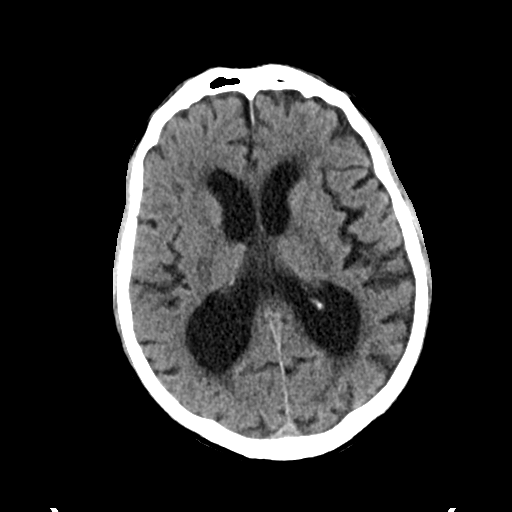
[im 19/36  bone]
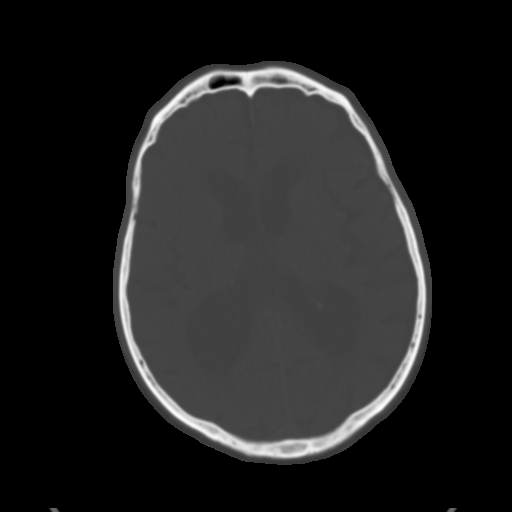
[im 21/36  brain]
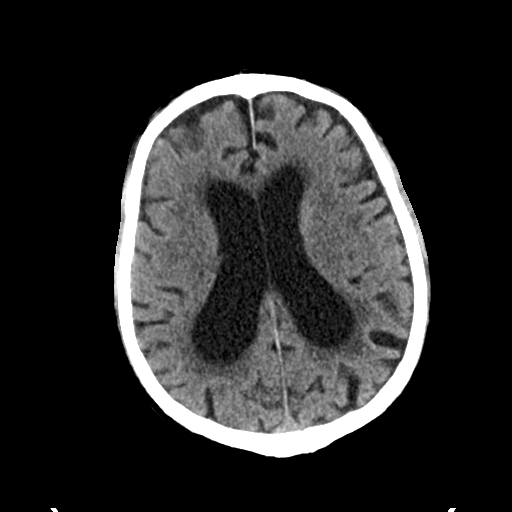
[im 23/36  brain]
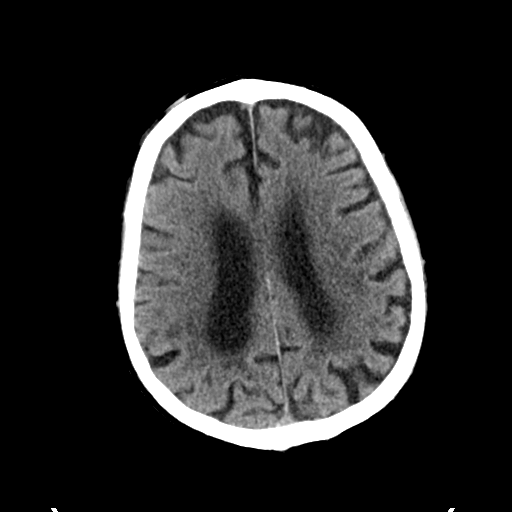
[im 26/36  brain]
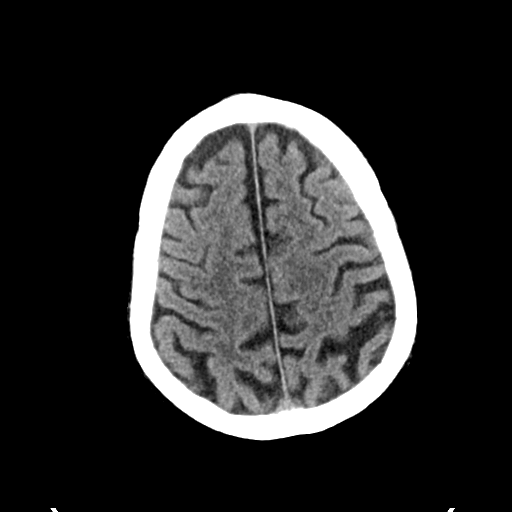
[im 27/36  brain]
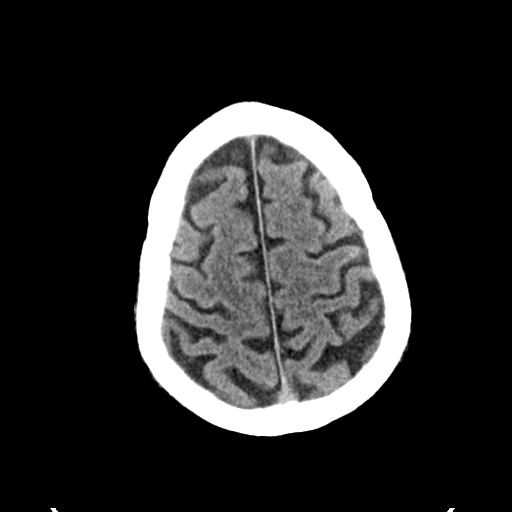
[im 27/36  bone]
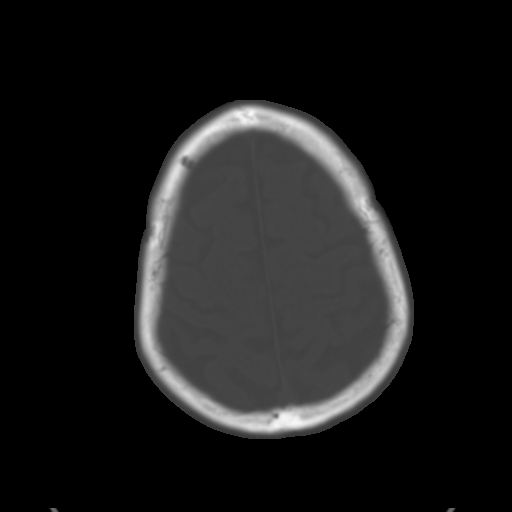
[im 29/36  brain]
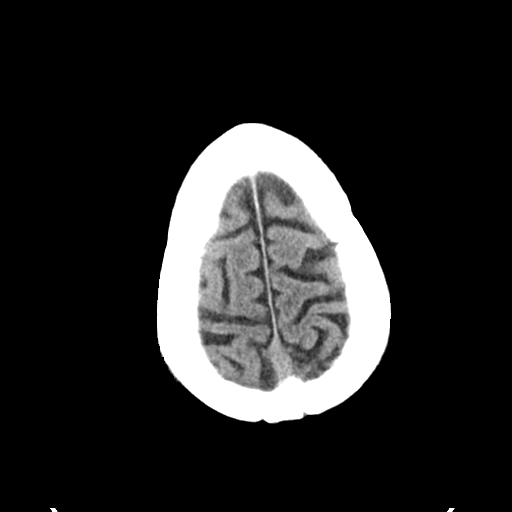
[im 32/36  brain]
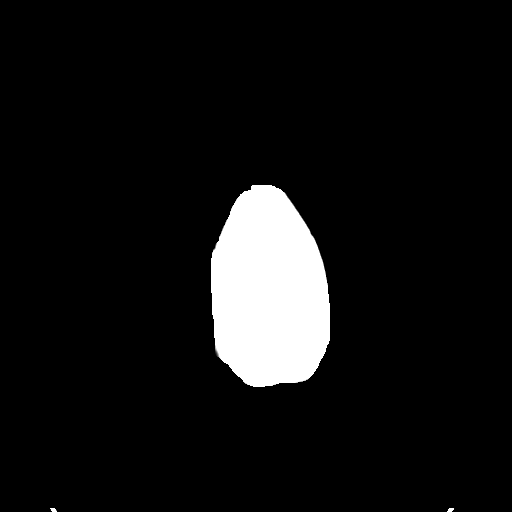
[im 34/36  brain]
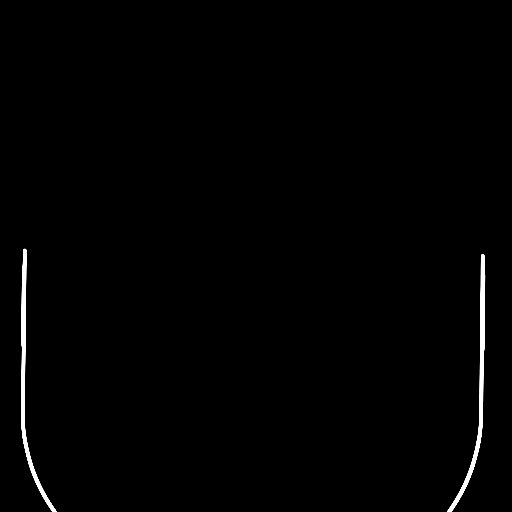

[16 of 30 positions shown; findings below may reference images not displayed]

FINDINGS: Age related cerebral atrophy, ventriculomegaly and periventricular
white matter disease. No extra-axial fluid collections are
identified. No CT findings for acute hemispheric infarction an or
intracranial hemorrhage. Benign-appearing bilateral basal ganglia
calcifications are noted. The brainstem and cerebellum are grossly
normal. Mild cerebellar atrophy.

No acute skull fracture. The visualized facial bones are intact. The
paranasal sinuses and mastoid air cells are clear. Minimal scattered
ethmoid mucoperiosteal thickening.
IMPRESSION: Age related cerebral atrophy, ventriculomegaly and periventricular
white matter disease.

No acute intracranial findings or skull fracture.

## 2014-09-26 IMAGING — CT CT HEAD W/O CM
2 series · 16 of 30 positions shown, 19 images · non-contrast
Comparison: 02/19/2013

CLINICAL DATA: Fall when transfer from wheelchair to toilet

EXAM:
CT HEAD WITHOUT CONTRAST
TECHNIQUE: Contiguous axial images were obtained from the base of the skull
through the vertex without intravenous contrast.

[Series 2: head w/o · axial · non-contrast · 0.48mm/px · z∈[-161,-41]mm · 9 of 32 slices shown, 12 images]
[im 4/32  brain]
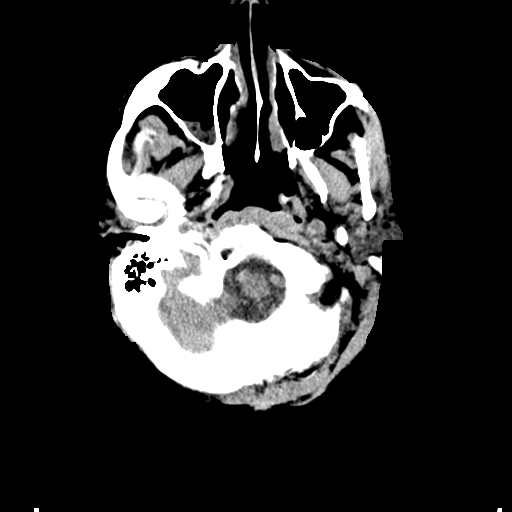
[im 4/32  bone]
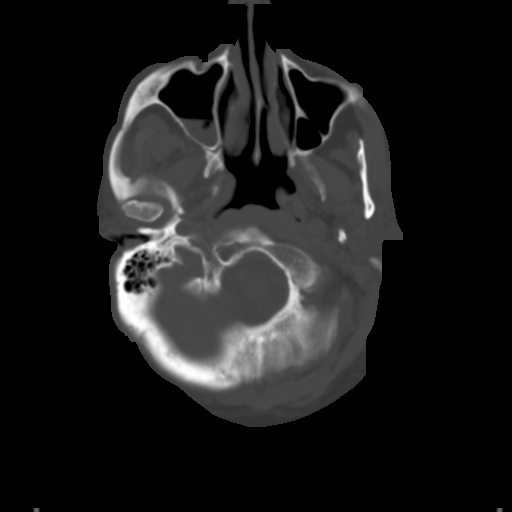
[im 7/32  brain]
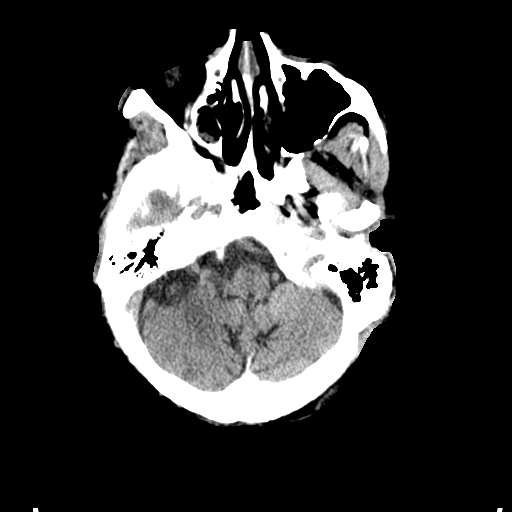
[im 10/32  brain]
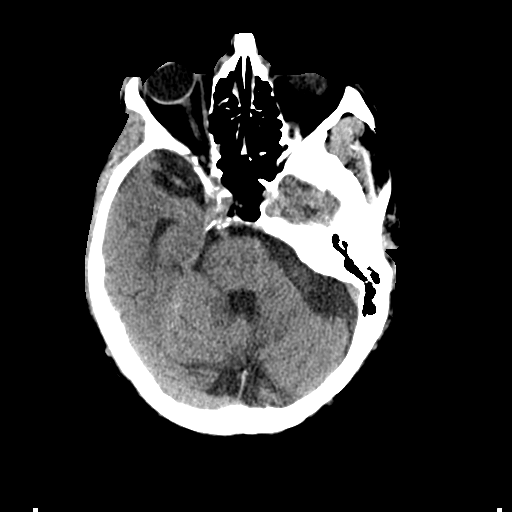
[im 13/32  brain]
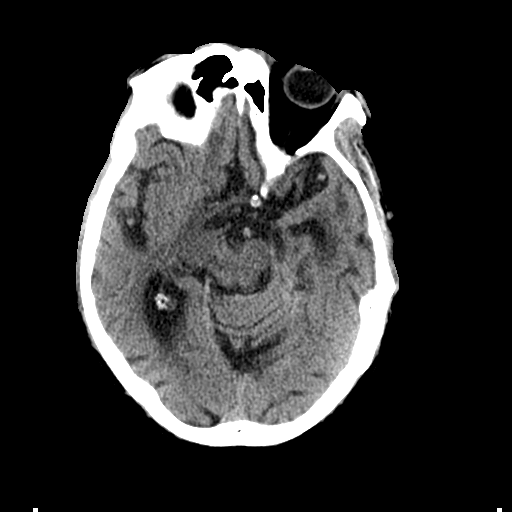
[im 16/32  brain]
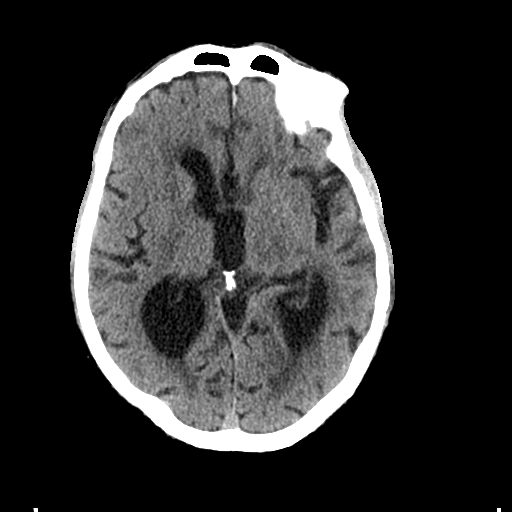
[im 16/32  bone]
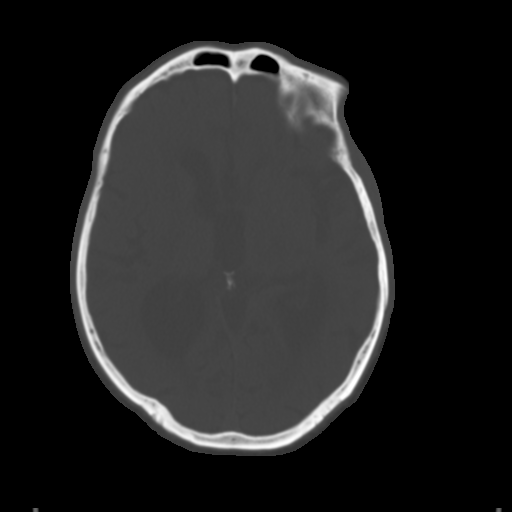
[im 19/32  brain]
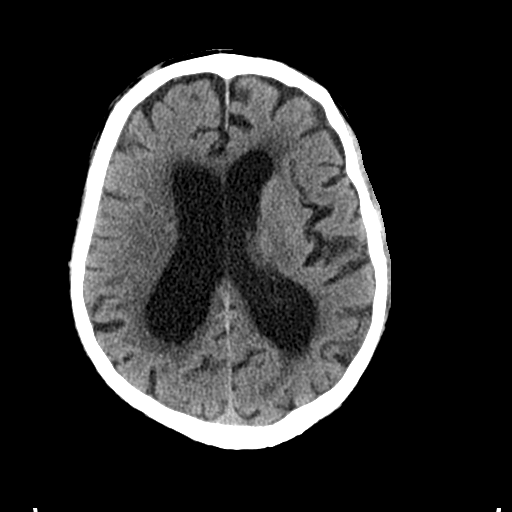
[im 22/32  brain]
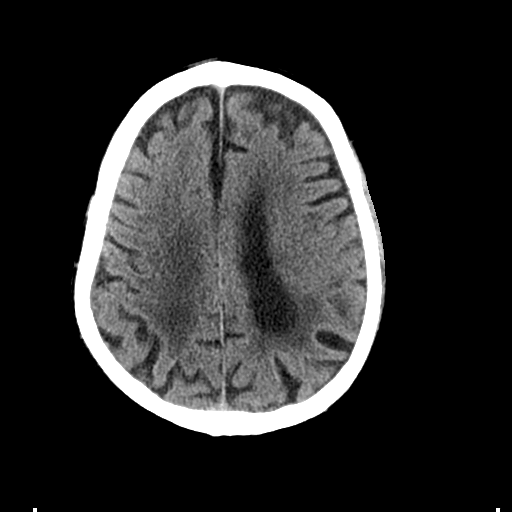
[im 25/32  brain]
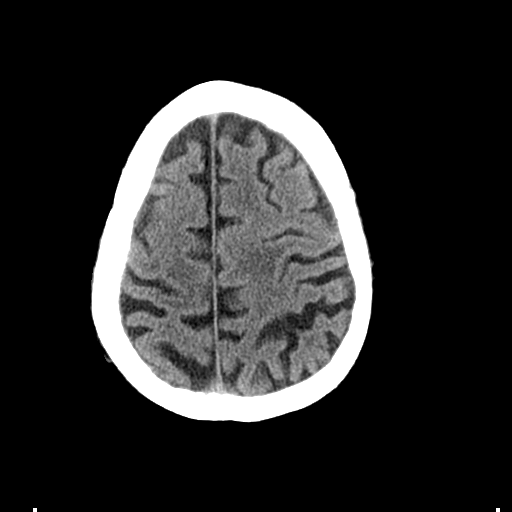
[im 28/32  brain]
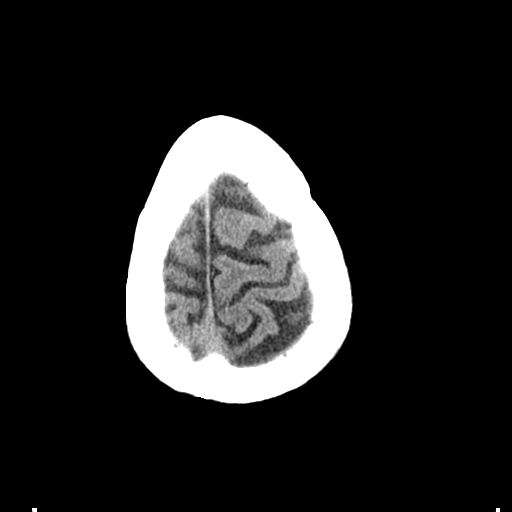
[im 28/32  bone]
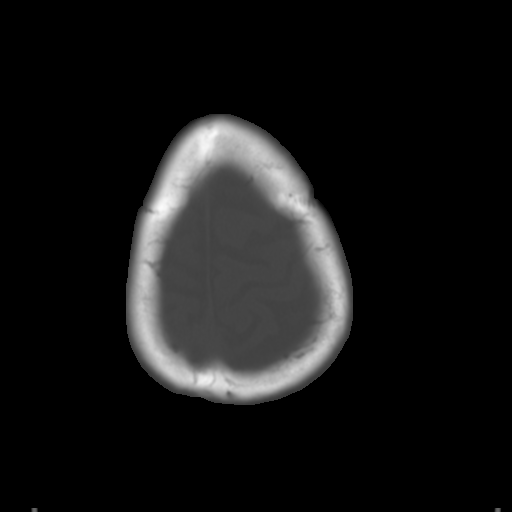

[Series 3: bone windows · axial · 0.48mm/px · z∈[-161,-56]mm · 7 of 53 slices shown]
[im 6/53  bone]
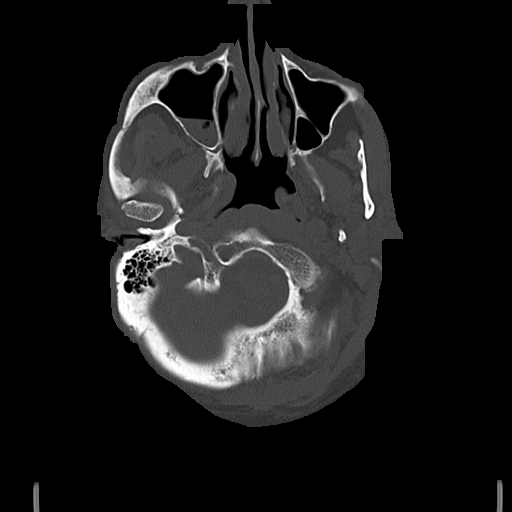
[im 12/53  bone]
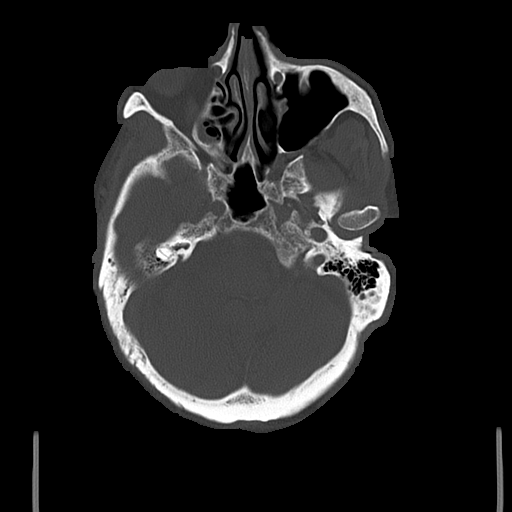
[im 18/53  bone]
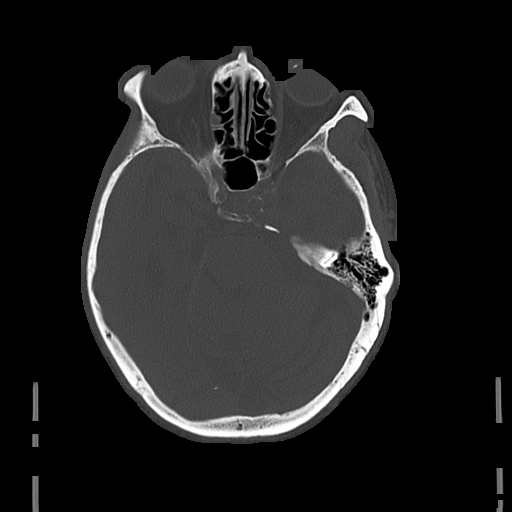
[im 24/53  bone]
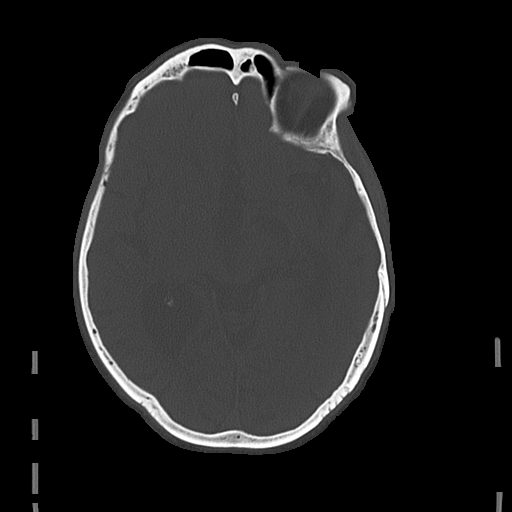
[im 29/53  bone]
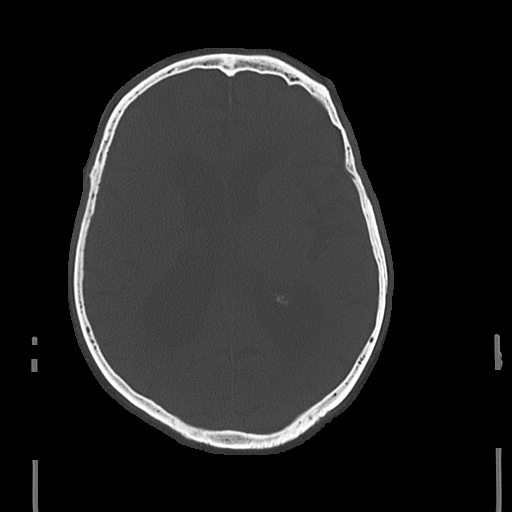
[im 35/53  bone]
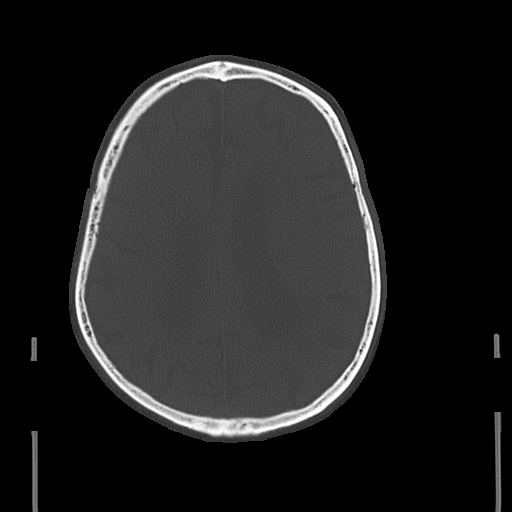
[im 41/53  bone]
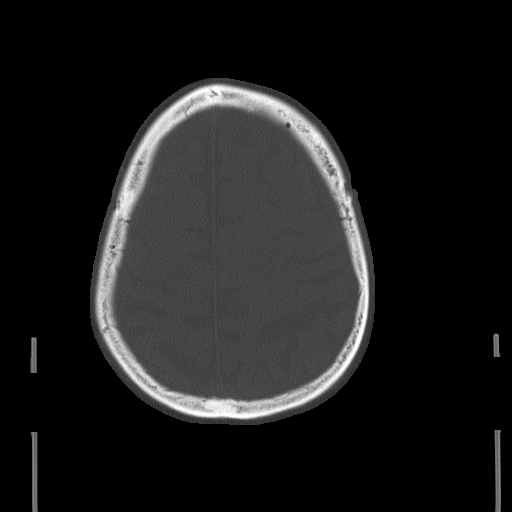

[16 of 30 positions shown; findings below may reference images not displayed]

FINDINGS: There is no evidence of mass effect, midline shift, or extra-axial
fluid collections. There is no evidence of a space-occupying lesion
or intracranial hemorrhage. There is no evidence of a cortical-based
area of acute infarction. There is generalized cerebral atrophy.
There is periventricular white matter low attenuation likely
secondary to microangiopathy.

The ventricles and sulci are appropriate for the patient's age. The
basal cisterns are patent.

Visualized portions of the orbits are unremarkable. There is right
maxillary sinus air-fluid level. There is a small left maxillary
sinus mucous retention cyst. Cerebrovascular atherosclerotic
calcifications are noted.

The osseous structures are unremarkable.
IMPRESSION: No acute intracranial pathology.
